# Patient Record
Sex: Male | Born: 1950
Health system: Southern US, Community
[De-identification: ages and names within clinical notes are randomized; demographics above are authoritative.]

## PROBLEM LIST (undated history)

## (undated) DIAGNOSIS — I10 Essential (primary) hypertension: Secondary | ICD-10-CM

## (undated) DIAGNOSIS — Z95820 Peripheral vascular angioplasty status with implants and grafts: Secondary | ICD-10-CM

## (undated) DIAGNOSIS — Z9109 Other allergy status, other than to drugs and biological substances: Secondary | ICD-10-CM

## (undated) DIAGNOSIS — T8859XA Other complications of anesthesia, initial encounter: Secondary | ICD-10-CM

## (undated) DIAGNOSIS — T4145XA Adverse effect of unspecified anesthetic, initial encounter: Secondary | ICD-10-CM

## (undated) DIAGNOSIS — Z87442 Personal history of urinary calculi: Secondary | ICD-10-CM

## (undated) DIAGNOSIS — J42 Unspecified chronic bronchitis: Secondary | ICD-10-CM

## (undated) DIAGNOSIS — Z8719 Personal history of other diseases of the digestive system: Secondary | ICD-10-CM

## (undated) DIAGNOSIS — I219 Acute myocardial infarction, unspecified: Secondary | ICD-10-CM

## (undated) DIAGNOSIS — K08109 Complete loss of teeth, unspecified cause, unspecified class: Secondary | ICD-10-CM

## (undated) DIAGNOSIS — I2510 Atherosclerotic heart disease of native coronary artery without angina pectoris: Secondary | ICD-10-CM

## (undated) DIAGNOSIS — Z972 Presence of dental prosthetic device (complete) (partial): Secondary | ICD-10-CM

## (undated) DIAGNOSIS — Z8739 Personal history of other diseases of the musculoskeletal system and connective tissue: Secondary | ICD-10-CM

## (undated) DIAGNOSIS — K4090 Unilateral inguinal hernia, without obstruction or gangrene, not specified as recurrent: Secondary | ICD-10-CM

## (undated) DIAGNOSIS — Z973 Presence of spectacles and contact lenses: Secondary | ICD-10-CM

## (undated) DIAGNOSIS — J189 Pneumonia, unspecified organism: Secondary | ICD-10-CM

## (undated) DIAGNOSIS — Z720 Tobacco use: Secondary | ICD-10-CM

## (undated) DIAGNOSIS — I214 Non-ST elevation (NSTEMI) myocardial infarction: Secondary | ICD-10-CM

## (undated) DIAGNOSIS — K219 Gastro-esophageal reflux disease without esophagitis: Secondary | ICD-10-CM

## (undated) HISTORY — PX: COLONOSCOPY W/ BIOPSIES AND POLYPECTOMY: SHX1376

## (undated) HISTORY — PX: MULTIPLE TOOTH EXTRACTIONS: SHX2053

## (undated) HISTORY — PX: APPENDECTOMY: SHX54

## (undated) HISTORY — PX: CHOLECYSTECTOMY: SHX55

---
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
## 1998-11-19 ENCOUNTER — Other Ambulatory Visit: Admission: RE | Admit: 1998-11-19 | Discharge: 1998-11-19 | Payer: Self-pay | Admitting: *Deleted

## 1999-02-04 ENCOUNTER — Encounter: Payer: Self-pay | Admitting: Neurosurgery

## 1999-02-04 ENCOUNTER — Ambulatory Visit (HOSPITAL_COMMUNITY): Admission: RE | Admit: 1999-02-04 | Discharge: 1999-02-04 | Payer: Self-pay | Admitting: Neurosurgery

## 1999-08-20 ENCOUNTER — Encounter: Payer: Self-pay | Admitting: Neurosurgery

## 1999-08-20 ENCOUNTER — Ambulatory Visit (HOSPITAL_COMMUNITY): Admission: RE | Admit: 1999-08-20 | Discharge: 1999-08-20 | Payer: Self-pay | Admitting: Neurosurgery

## 2000-02-08 ENCOUNTER — Encounter: Payer: Self-pay | Admitting: Neurosurgery

## 2000-02-08 ENCOUNTER — Ambulatory Visit (HOSPITAL_COMMUNITY): Admission: RE | Admit: 2000-02-08 | Discharge: 2000-02-08 | Payer: Self-pay | Admitting: Neurosurgery

## 2000-08-15 ENCOUNTER — Ambulatory Visit (HOSPITAL_COMMUNITY): Admission: RE | Admit: 2000-08-15 | Discharge: 2000-08-15 | Payer: Self-pay | Admitting: Neurosurgery

## 2000-08-15 ENCOUNTER — Encounter: Payer: Self-pay | Admitting: Neurosurgery

## 2001-11-12 ENCOUNTER — Emergency Department (HOSPITAL_COMMUNITY): Admission: EM | Admit: 2001-11-12 | Discharge: 2001-11-12 | Payer: Self-pay | Admitting: Emergency Medicine

## 2001-11-13 ENCOUNTER — Ambulatory Visit (HOSPITAL_COMMUNITY): Admission: RE | Admit: 2001-11-13 | Discharge: 2001-11-13 | Payer: Self-pay

## 2001-11-17 ENCOUNTER — Ambulatory Visit (HOSPITAL_COMMUNITY): Admission: RE | Admit: 2001-11-17 | Discharge: 2001-11-17 | Payer: Self-pay

## 2003-06-24 ENCOUNTER — Other Ambulatory Visit: Admission: RE | Admit: 2003-06-24 | Discharge: 2003-06-24 | Payer: Self-pay | Admitting: Otolaryngology

## 2003-11-18 ENCOUNTER — Ambulatory Visit (HOSPITAL_COMMUNITY): Admission: RE | Admit: 2003-11-18 | Discharge: 2003-11-18 | Payer: Self-pay | Admitting: Internal Medicine

## 2010-12-06 ENCOUNTER — Encounter: Payer: Self-pay | Admitting: Pulmonary Disease

## 2012-06-07 ENCOUNTER — Emergency Department (HOSPITAL_COMMUNITY)
Admission: EM | Admit: 2012-06-07 | Discharge: 2012-06-07 | Disposition: A | Discharge status: Home / Self Care | Payer: No Typology Code available for payment source | Attending: Emergency Medicine | Admitting: Emergency Medicine

## 2012-06-07 ENCOUNTER — Emergency Department (HOSPITAL_COMMUNITY): Payer: No Typology Code available for payment source

## 2012-06-07 ENCOUNTER — Encounter (HOSPITAL_COMMUNITY): Payer: Self-pay | Admitting: *Deleted

## 2012-06-07 DIAGNOSIS — N200 Calculus of kidney: Secondary | ICD-10-CM | POA: Insufficient documentation

## 2012-06-07 DIAGNOSIS — R1032 Left lower quadrant pain: Secondary | ICD-10-CM | POA: Insufficient documentation

## 2012-06-07 LAB — CBC WITH DIFFERENTIAL/PLATELET
Basophils Absolute: 0 10*3/uL (ref 0.0–0.1)
Basophils Relative: 0 % (ref 0–1)
Eosinophils Absolute: 0 10*3/uL (ref 0.0–0.7)
Eosinophils Relative: 0 % (ref 0–5)
HCT: 46.6 % (ref 39.0–52.0)
MCH: 31.5 pg (ref 26.0–34.0)
MCHC: 35.2 g/dL (ref 30.0–36.0)
Monocytes Absolute: 1 10*3/uL (ref 0.1–1.0)
Neutro Abs: 9.8 10*3/uL — ABNORMAL HIGH (ref 1.7–7.7)
RDW: 13.4 % (ref 11.5–15.5)

## 2012-06-07 LAB — COMPREHENSIVE METABOLIC PANEL
AST: 20 U/L (ref 0–37)
Albumin: 4 g/dL (ref 3.5–5.2)
Calcium: 9.6 mg/dL (ref 8.4–10.5)
Chloride: 104 mEq/L (ref 96–112)
Creatinine, Ser: 2.08 mg/dL — ABNORMAL HIGH (ref 0.50–1.35)
Total Protein: 8 g/dL (ref 6.0–8.3)

## 2012-06-07 MED ORDER — ONDANSETRON HCL 4 MG/2ML IJ SOLN
4.0000 mg | Freq: Once | INTRAMUSCULAR | Status: AC
  Administered 2012-06-07: 4 mg via INTRAVENOUS
  Filled 2012-06-07: qty 2

## 2012-06-07 MED ORDER — CIPROFLOXACIN HCL 500 MG PO TABS: 500.0000 mg | ORAL_TABLET | Freq: Two times a day (BID) | ORAL | Status: AC

## 2012-06-07 MED ORDER — KETOROLAC TROMETHAMINE 30 MG/ML IJ SOLN
30.0000 mg | Freq: Once | INTRAMUSCULAR | Status: AC
  Administered 2012-06-07: 30 mg via INTRAVENOUS
  Filled 2012-06-07: qty 1

## 2012-06-07 MED ORDER — PROMETHAZINE HCL 25 MG PO TABS: 25.0000 mg | ORAL_TABLET | Freq: Four times a day (QID) | ORAL | Status: DC | PRN

## 2012-06-07 MED ORDER — OXYCODONE-ACETAMINOPHEN 5-325 MG PO TABS: 1.0000 | ORAL_TABLET | Freq: Four times a day (QID) | ORAL | Status: AC | PRN

## 2012-06-07 MED ORDER — OXYCODONE-ACETAMINOPHEN 5-325 MG PO TABS: 1.0000 | ORAL_TABLET | Freq: Four times a day (QID) | ORAL | Status: DC | PRN

## 2012-06-07 MED ORDER — TAMSULOSIN HCL 0.4 MG PO CAPS: 0.4000 mg | ORAL_CAPSULE | Freq: Every day | ORAL | Status: DC

## 2012-06-07 MED ORDER — HYDROMORPHONE HCL PF 1 MG/ML IJ SOLN
1.0000 mg | Freq: Once | INTRAMUSCULAR | Status: AC
  Administered 2012-06-07: 1 mg via INTRAVENOUS
  Filled 2012-06-07: qty 1

## 2012-06-07 NOTE — ED Provider Notes (Addendum)
History  This chart was scribed for Benny Lennert, MD by Erskine Emery. This patient was seen in room APA02/APA02 and the patient's care was started at 22:13.   CSN: [id number]  Arrival date & time 06/07/12  2201   First MD Initiated Contact with Patient 06/07/12 2213      Chief Complaint  Patient presents with  . Abdominal Pain    (Consider location/radiation/quality/duration/timing/severity/associated sxs/prior Treatment) [name] is a 61 y.o. male who presents to the Emergency Department.  Patient is a 61 y.o. male presenting with abdominal pain. The history is provided by the patient. No language interpreter was used.  Abdominal Pain The primary symptoms of the illness include abdominal pain (LLQ), nausea and vomiting. The primary symptoms of the illness do not include fever, fatigue, diarrhea or dysuria. The current episode started yesterday. The onset of the illness was gradual. The problem has been gradually worsening.  Associated with: nothing. Symptoms associated with the illness do not include hematuria, frequency or back pain.  Pt reports he first noticed his left abdominal pain yesterday afternoon and the pain climaxed last night.  Pt reports a previous episode of similar symptoms only on the right for which he was diagnosed with gastritis. Pt has a h/o cholecystectomy and appendectomy.    History reviewed. No pertinent past medical history.  Past Surgical History  Procedure Date  . Cholecystectomy   . Appendectomy     History reviewed. No pertinent family history.  History  Substance Use Topics  . Smoking status: Current Everyday Smoker  . Smokeless tobacco: Not on file  . Alcohol Use: No      Review of Systems  Constitutional: Negative for fever and fatigue.  HENT: Negative for congestion, sinus pressure and ear discharge.   Eyes: Negative for discharge.  Respiratory: Negative for cough.   Cardiovascular: Negative for chest pain.  Gastrointestinal:  Positive for nausea, vomiting and abdominal pain (LLQ). Negative for diarrhea.  Genitourinary: Negative for dysuria, frequency and hematuria.  Musculoskeletal: Negative for back pain.  Skin: Negative for rash.  Neurological: Negative for seizures and headaches.  Hematological: Negative.   Psychiatric/Behavioral: Negative for hallucinations.    Allergies  Sulfa antibiotics  Home Medications  No current outpatient prescriptions on file.  Triage Vitals: BP 242/136  Pulse 87  Temp 99.5 F (37.5 C) (Oral)  Resp 20  Ht 5\' 7"  (1.702 m)  Wt 148 lb (67.132 kg)  BMI 23.18 kg/m2  SpO2 98%  Physical Exam  Constitutional: He is oriented to person, place, and time. He appears well-developed and well-nourished.  HENT:  Head: Normocephalic and atraumatic.  Eyes: Conjunctivae and EOM are normal. No scleral icterus.  Neck: Neck supple. No thyromegaly present.  Cardiovascular: Normal rate and regular rhythm.   No murmur heard. Pulmonary/Chest: Effort normal. No stridor.  Abdominal: He exhibits no distension. There is tenderness. There is no rebound.       Moderate tenderness in LLQ  Musculoskeletal: Normal range of motion. He exhibits no edema.  Neurological: He is oriented to person, place, and time. Coordination normal.  Skin: No rash noted. No erythema.  Psychiatric: He has a normal mood and affect. His behavior is normal.    ED Course  Procedures (including critical care time)  DIAGNOSTIC STUDIES: Oxygen Saturation is 98% on room air, normal by my interpretation.    COORDINATION OF CARE: 22:15--I evaluated the patient and we discussed a treatment plan including pain medication to which the pt agreed.  22:30--Medication orders: Hydromorphone (Dilaudid) injection 1 mg--once   Ondansetron (Zofran) injection 4 mg--once   Labs Reviewed - No data to display No results found.   No diagnosis found.   Date: 06/07/2012  Rate  70  Rhythm: normal sinus rhythm  QRS Axis: left   Intervals: normal  ST/T Wave abnormalities: nonspecific ST changes  Conduction Disutrbances:none  Narrative Interpretation:   Old EKG Reviewed: none available    MDM   The chart was scribed for me under my direct supervision.  I personally performed the history, physical, and medical decision making and all procedures in the evaluation of this patient.Benny Lennert, MD 06/07/12 2310  Benny Lennert, MD 06/07/12 [phone]

## 2012-06-07 NOTE — ED Notes (Signed)
Discussed pt's BP with EDP.  No orders received and pt okay to be discharged.

## 2012-06-07 NOTE — ED Notes (Addendum)
Lt abd pain since Tues, N/V,  No injury  Vomiting at triage

## 2012-06-08 ENCOUNTER — Emergency Department (HOSPITAL_COMMUNITY)
Admission: EM | Admit: 2012-06-08 | Discharge: 2012-06-08 | Disposition: A | Discharge status: Home / Self Care | Payer: Self-pay | Attending: Emergency Medicine | Admitting: Emergency Medicine

## 2012-06-08 ENCOUNTER — Encounter (HOSPITAL_COMMUNITY): Payer: Self-pay | Admitting: *Deleted

## 2012-06-08 DIAGNOSIS — N201 Calculus of ureter: Secondary | ICD-10-CM | POA: Insufficient documentation

## 2012-06-08 DIAGNOSIS — Z9089 Acquired absence of other organs: Secondary | ICD-10-CM | POA: Insufficient documentation

## 2012-06-08 DIAGNOSIS — F17200 Nicotine dependence, unspecified, uncomplicated: Secondary | ICD-10-CM | POA: Insufficient documentation

## 2012-06-08 MED ORDER — ONDANSETRON HCL 4 MG/2ML IJ SOLN
4.0000 mg | Freq: Once | INTRAMUSCULAR | Status: AC
  Administered 2012-06-08: 4 mg via INTRAMUSCULAR
  Filled 2012-06-08: qty 2

## 2012-06-08 MED ORDER — HYDROMORPHONE HCL PF 2 MG/ML IJ SOLN
1.0000 mg | Freq: Once | INTRAMUSCULAR | Status: AC
  Administered 2012-06-08: 1 mg via INTRAMUSCULAR
  Filled 2012-06-08 (×2): qty 1

## 2012-06-08 NOTE — ED Notes (Signed)
Left flank pain, seen yesterday for kidney stone pain, worse today, unable to get comfortable

## 2012-06-08 NOTE — ED Notes (Addendum)
Pt decided to stay and no longer leave AMA. Patient will be discharged normally

## 2012-06-08 NOTE — ED Notes (Signed)
Kidneystone pain.

## 2012-06-08 NOTE — ED Provider Notes (Signed)
History     CSN: [id number]  Arrival date & time 06/08/12  1904   First MD Initiated Contact with Patient 06/08/12 2007      Chief Complaint  Patient presents with  . Nephrolithiasis    (Consider location/radiation/quality/duration/timing/severity/associated sxs/prior treatment) HPI Patient reports she started having left-sided flank and abdominal pain 7 days ago. He was seen in the ED last night and diagnosed as having a ureteral stone. He reports when he takes a Percocet one tablet every is to 6 hours he only gets pain for least for about 2 hours. He relates he talked to somebody on the phone and they told him to come back stronger. He denies nausea, vomiting, or fever. He states his pain comes and goes and states it's starting to come back now.  PCP Dr. Juanetta Gosling  History reviewed. No pertinent past medical history.  Past Surgical History  Procedure Date  . Cholecystectomy   . Appendectomy     No family history on file.  History  Substance Use Topics  . Smoking status: Current Everyday Smoker  . Smokeless tobacco: Not on file  . Alcohol Use: No      Review of Systems  All other systems reviewed and are negative.    Allergies  Sulfa antibiotics  Home Medications   Current Outpatient Rx  Name Route Sig Dispense Refill  . OXYCODONE-ACETAMINOPHEN 5-325 MG PO TABS Oral Take 1 tablet by mouth every 6 (six) hours as needed for pain. 6 tablet 0  . BISACODYL 10 MG RE SUPP Rectal Place 10 mg rectally as needed.    Marland Kitchen CIPROFLOXACIN HCL 500 MG PO TABS Oral Take 1 tablet (500 mg total) by mouth every 12 (twelve) hours. 14 tablet 0  . PROMETHAZINE HCL 25 MG PO TABS Oral Take 1 tablet (25 mg total) by mouth every 6 (six) hours as needed for nausea. 15 tablet 0  . SIMETHICONE 125 MG PO CAPS Oral Take 1-2 capsules by mouth as needed.    Marland Kitchen TAMSULOSIN HCL 0.4 MG PO CAPS Oral Take 1 capsule (0.4 mg total) by mouth daily. 4 capsule 0    BP 226/120  Pulse 65  Temp 98.2 F (36.8  C) (Oral)  Resp 16  Ht 5\' 7"  (1.702 m)  Wt 150 lb (68.04 kg)  BMI 23.49 kg/m2  SpO2 99%  Vital signs normal except hypertension   Physical Exam  Nursing note and vitals reviewed. Constitutional: He appears well-developed and well-nourished. He is cooperative.  HENT:  Head: Normocephalic and atraumatic.  Eyes: Conjunctivae and EOM are normal. Pupils are equal, round, and reactive to light.  Neck: Normal range of motion and full passive range of motion without pain. Neck supple.  Cardiovascular: Normal rate, regular rhythm and normal heart sounds.   Pulmonary/Chest: Effort normal and breath sounds normal. Not tachypneic. No respiratory distress.  Neurological: He has normal strength and normal reflexes. No cranial nerve deficit.  Skin: Skin is warm, dry and intact.  Psychiatric: He has a normal mood and affect. His speech is normal and behavior is normal. Thought content normal.    ED Course  Procedures (including critical care time)   Medications  HYDROmorphone (DILAUDID) injection 1 mg (1 mg Intramuscular Given 06/08/12 2110)  ondansetron (ZOFRAN) injection 4 mg (4 mg Intramuscular Given 06/08/12 2110)    Discussed with patient he can take 2 pain tablets at a time. He states he got in injection last night and slept all night, it was repeated tonight.  Ct Abdomen Pelvis Wo Contrast  06/07/2012  *RADIOLOGY REPORT*  Clinical Data: Left lower quadrant abdominal pain.  CT ABDOMEN AND PELVIS WITHOUT CONTRAST  Technique:  Multidetector CT imaging of the abdomen and pelvis was performed following the standard protocol without intravenous contrast.  Comparison: None.  Findings: There is evidence of moderate left-sided hydronephrosis and hydroureter.  A calculus is present in the distal ureter nearly at the ureterovesicle junction and measuring approximately 4 mm in greatest dimensions.  Additional 7 mm calculus is present in the lower pole collecting system of the left kidney.  There is a  cyst in the medial left kidney measuring approximately 2.4 cm and demonstrating benign fluid density.  No right-sided renal calculi are identified.  The gallbladder has been removed.  Unenhanced appearance of other solid organs in the abdomen and the bowel are unremarkable.  The bladder is moderately distended.  No hernias are identified.  No incidental masses or enlarged lymph nodes.  No bony abnormalities.  IMPRESSION: Left-sided hydronephrosis secondary to a roughly 4 mm calculus in the distal ureter nearly at the ureterovesicle junction.  There is an additional 7 mm calculus in the lower pole collecting system of the left kidney.  Original Report Authenticated By: Reola Calkins, M.D.     1. Ureteral stone    Plan discharge  Devoria Albe, MD, FACEP    MDM          Ward Givens, MD 06/08/12 2127

## 2012-06-13 MED FILL — Oxycodone w/ Acetaminophen Tab 5-325 MG: ORAL | Qty: 6 | Status: AC

## 2012-08-15 DIAGNOSIS — I219 Acute myocardial infarction, unspecified: Secondary | ICD-10-CM

## 2012-08-15 HISTORY — DX: Acute myocardial infarction, unspecified: I21.9

## 2012-08-25 ENCOUNTER — Inpatient Hospital Stay (HOSPITAL_COMMUNITY): Payer: No Typology Code available for payment source

## 2012-08-25 ENCOUNTER — Ambulatory Visit (HOSPITAL_COMMUNITY): Admit: 2012-08-25 | Payer: Self-pay | Admitting: Cardiology

## 2012-08-25 ENCOUNTER — Encounter (HOSPITAL_COMMUNITY): Payer: Self-pay | Admitting: *Deleted

## 2012-08-25 ENCOUNTER — Inpatient Hospital Stay (HOSPITAL_COMMUNITY)
Admission: EM | Admit: 2012-08-25 | Discharge: 2012-08-28 | DRG: 247 | Disposition: A | Discharge status: Home / Self Care | Payer: No Typology Code available for payment source | Attending: Cardiology | Admitting: Cardiology

## 2012-08-25 ENCOUNTER — Encounter (HOSPITAL_COMMUNITY)
Admission: EM | Disposition: A | Discharge status: Home / Self Care | Payer: Self-pay | Source: Home / Self Care | Attending: Cardiology

## 2012-08-25 ENCOUNTER — Other Ambulatory Visit: Payer: Self-pay

## 2012-08-25 DIAGNOSIS — R079 Chest pain, unspecified: Secondary | ICD-10-CM

## 2012-08-25 DIAGNOSIS — I2510 Atherosclerotic heart disease of native coronary artery without angina pectoris: Secondary | ICD-10-CM

## 2012-08-25 DIAGNOSIS — Z881 Allergy status to other antibiotic agents status: Secondary | ICD-10-CM

## 2012-08-25 DIAGNOSIS — I2119 ST elevation (STEMI) myocardial infarction involving other coronary artery of inferior wall: Principal | ICD-10-CM | POA: Diagnosis present

## 2012-08-25 DIAGNOSIS — I4949 Other premature depolarization: Secondary | ICD-10-CM | POA: Diagnosis present

## 2012-08-25 DIAGNOSIS — Z8249 Family history of ischemic heart disease and other diseases of the circulatory system: Secondary | ICD-10-CM

## 2012-08-25 DIAGNOSIS — R259 Unspecified abnormal involuntary movements: Secondary | ICD-10-CM | POA: Diagnosis not present

## 2012-08-25 DIAGNOSIS — I4460 Unspecified fascicular block: Secondary | ICD-10-CM | POA: Diagnosis present

## 2012-08-25 DIAGNOSIS — G4730 Sleep apnea, unspecified: Secondary | ICD-10-CM | POA: Diagnosis present

## 2012-08-25 DIAGNOSIS — I119 Hypertensive heart disease without heart failure: Secondary | ICD-10-CM

## 2012-08-25 DIAGNOSIS — I498 Other specified cardiac arrhythmias: Secondary | ICD-10-CM | POA: Diagnosis present

## 2012-08-25 DIAGNOSIS — Z720 Tobacco use: Secondary | ICD-10-CM

## 2012-08-25 DIAGNOSIS — I10 Essential (primary) hypertension: Secondary | ICD-10-CM | POA: Diagnosis present

## 2012-08-25 DIAGNOSIS — F17200 Nicotine dependence, unspecified, uncomplicated: Secondary | ICD-10-CM | POA: Diagnosis present

## 2012-08-25 DIAGNOSIS — Z882 Allergy status to sulfonamides status: Secondary | ICD-10-CM

## 2012-08-25 HISTORY — DX: Atherosclerotic heart disease of native coronary artery without angina pectoris: I25.10

## 2012-08-25 HISTORY — DX: Tobacco use: Z72.0

## 2012-08-25 HISTORY — DX: Other allergy status, other than to drugs and biological substances: Z91.09

## 2012-08-25 HISTORY — DX: Essential (primary) hypertension: I10

## 2012-08-25 HISTORY — PX: LEFT HEART CATHETERIZATION WITH CORONARY ANGIOGRAM: SHX5451

## 2012-08-25 HISTORY — PX: PERCUTANEOUS CORONARY STENT INTERVENTION (PCI-S): SHX5485

## 2012-08-25 LAB — CBC WITH DIFFERENTIAL/PLATELET
Eosinophils Absolute: 0.3 10*3/uL (ref 0.0–0.7)
Eosinophils Relative: 3 % (ref 0–5)
HCT: 42.5 % (ref 39.0–52.0)
Hemoglobin: 14.7 g/dL (ref 13.0–17.0)
Lymphocytes Relative: 14 % (ref 12–46)
Lymphs Abs: 1.2 10*3/uL (ref 0.7–4.0)
MCH: 31.8 pg (ref 26.0–34.0)
MCV: 92 fL (ref 78.0–100.0)
Monocytes Relative: 7 % (ref 3–12)
RBC: 4.62 MIL/uL (ref 4.22–5.81)
WBC: 9.1 10*3/uL (ref 4.0–10.5)

## 2012-08-25 LAB — COMPREHENSIVE METABOLIC PANEL
Alkaline Phosphatase: 92 U/L (ref 39–117)
BUN: 17 mg/dL (ref 6–23)
CO2: 25 mEq/L (ref 19–32)
Calcium: 9.9 mg/dL (ref 8.4–10.5)
GFR calc Af Amer: 72 mL/min — ABNORMAL LOW (ref >90)
GFR calc non Af Amer: 62 mL/min — ABNORMAL LOW (ref >90)
Glucose, Bld: 101 mg/dL — ABNORMAL HIGH (ref 70–99)
Total Protein: 7.4 g/dL (ref 6.0–8.3)

## 2012-08-25 LAB — APTT: aPTT: 29 seconds (ref 24–37)

## 2012-08-25 SURGERY — LEFT HEART CATHETERIZATION WITH CORONARY ANGIOGRAM
Anesthesia: LOCAL

## 2012-08-25 MED ORDER — ACETAMINOPHEN 325 MG PO TABS: 650.0000 mg | ORAL_TABLET | ORAL | Status: DC | PRN

## 2012-08-25 MED ORDER — ASPIRIN 81 MG PO CHEW
CHEWABLE_TABLET | ORAL | Status: AC
  Filled 2012-08-25: qty 4

## 2012-08-25 MED ORDER — MORPHINE SULFATE 4 MG/ML IJ SOLN
4.0000 mg | Freq: Once | INTRAMUSCULAR | Status: AC
  Administered 2012-08-25: 4 mg via INTRAVENOUS
  Filled 2012-08-25: qty 1

## 2012-08-25 MED ORDER — ASPIRIN EC 81 MG PO TBEC
81.0000 mg | DELAYED_RELEASE_TABLET | Freq: Every day | ORAL | Status: DC
  Administered 2012-08-26 – 2012-08-28 (×3): 81 mg via ORAL
  Filled 2012-08-25 (×3): qty 1

## 2012-08-25 MED ORDER — NITROGLYCERIN 0.2 MG/ML ON CALL CATH LAB
INTRAVENOUS | Status: AC
  Administered 2012-08-25: 19:00:00
  Filled 2012-08-25: qty 1

## 2012-08-25 MED ORDER — FENTANYL CITRATE 0.05 MG/ML IJ SOLN
INTRAMUSCULAR | Status: AC
  Filled 2012-08-25: qty 2

## 2012-08-25 MED ORDER — PRASUGREL HCL 10 MG PO TABS
10.0000 mg | ORAL_TABLET | Freq: Every day | ORAL | Status: DC
  Administered 2012-08-26 – 2012-08-28 (×3): 10 mg via ORAL
  Filled 2012-08-25 (×3): qty 1

## 2012-08-25 MED ORDER — ONDANSETRON HCL 4 MG/2ML IJ SOLN
INTRAMUSCULAR | Status: AC
  Administered 2012-08-25: 4 mg
  Filled 2012-08-25: qty 2

## 2012-08-25 MED ORDER — HYDRALAZINE HCL 20 MG/ML IJ SOLN
INTRAMUSCULAR | Status: AC
  Administered 2012-08-25: 10 mg
  Filled 2012-08-25: qty 1

## 2012-08-25 MED ORDER — SODIUM CHLORIDE 0.9 % IV SOLN
INTRAVENOUS | Status: DC
  Administered 2012-08-25: 20 mL/h via INTRAVENOUS

## 2012-08-25 MED ORDER — ONDANSETRON HCL 4 MG/2ML IJ SOLN
4.0000 mg | Freq: Four times a day (QID) | INTRAMUSCULAR | Status: DC | PRN
  Administered 2012-08-25: 4 mg via INTRAVENOUS
  Filled 2012-08-25: qty 2

## 2012-08-25 MED ORDER — SODIUM CHLORIDE 0.9 % IV SOLN: INTRAVENOUS | Status: AC

## 2012-08-25 MED ORDER — SODIUM CHLORIDE 0.9 % IV SOLN
0.2500 mg/kg/h | INTRAVENOUS | Status: AC
  Filled 2012-08-25: qty 250

## 2012-08-25 MED ORDER — MIDAZOLAM HCL 2 MG/2ML IJ SOLN
INTRAMUSCULAR | Status: AC
  Filled 2012-08-25: qty 2

## 2012-08-25 MED ORDER — ATORVASTATIN CALCIUM 80 MG PO TABS
80.0000 mg | ORAL_TABLET | Freq: Every day | ORAL | Status: DC
  Administered 2012-08-27: 80 mg via ORAL
  Filled 2012-08-25 (×3): qty 1

## 2012-08-25 MED ORDER — NITROGLYCERIN 0.4 MG SL SUBL: 0.4000 mg | SUBLINGUAL_TABLET | SUBLINGUAL | Status: DC | PRN

## 2012-08-25 MED ORDER — NITROGLYCERIN IN D5W 200-5 MCG/ML-% IV SOLN
5.0000 ug/min | Freq: Once | INTRAVENOUS | Status: AC
  Administered 2012-08-25: 5 ug/min via INTRAVENOUS
  Filled 2012-08-25: qty 250

## 2012-08-25 MED ORDER — HEPARIN (PORCINE) IN NACL 2-0.9 UNIT/ML-% IJ SOLN
INTRAMUSCULAR | Status: AC
  Filled 2012-08-25: qty 1000

## 2012-08-25 MED ORDER — TAMSULOSIN HCL 0.4 MG PO CAPS
0.4000 mg | ORAL_CAPSULE | Freq: Every day | ORAL | Status: DC
  Administered 2012-08-26 – 2012-08-27 (×2): 0.4 mg via ORAL
  Filled 2012-08-25 (×4): qty 1

## 2012-08-25 MED ORDER — NITROGLYCERIN 0.4 MG SL SUBL
SUBLINGUAL_TABLET | SUBLINGUAL | Status: AC
  Filled 2012-08-25: qty 25

## 2012-08-25 MED ORDER — BIVALIRUDIN 250 MG IV SOLR
INTRAVENOUS | Status: AC
  Filled 2012-08-25: qty 250

## 2012-08-25 MED ORDER — ZOLPIDEM TARTRATE 5 MG PO TABS: 5.0000 mg | ORAL_TABLET | Freq: Every evening | ORAL | Status: DC | PRN

## 2012-08-25 MED ORDER — PRASUGREL HCL 10 MG PO TABS
ORAL_TABLET | ORAL | Status: AC
  Filled 2012-08-25: qty 6

## 2012-08-25 MED ORDER — METOPROLOL TARTRATE 12.5 MG HALF TABLET
12.5000 mg | ORAL_TABLET | Freq: Two times a day (BID) | ORAL | Status: DC
  Administered 2012-08-25 – 2012-08-28 (×6): 12.5 mg via ORAL
  Filled 2012-08-25 (×7): qty 1

## 2012-08-25 MED ORDER — ONDANSETRON HCL 4 MG/5ML PO SOLN
4.0000 mg | Freq: Once | ORAL | Status: AC
  Filled 2012-08-25: qty 1

## 2012-08-25 MED ORDER — ATROPINE SULFATE 1 MG/ML IJ SOLN
INTRAMUSCULAR | Status: AC
  Filled 2012-08-25: qty 1

## 2012-08-25 MED ORDER — LIDOCAINE HCL (PF) 1 % IJ SOLN
INTRAMUSCULAR | Status: AC
  Filled 2012-08-25: qty 30

## 2012-08-25 MED ORDER — HEPARIN SODIUM (PORCINE) 5000 UNIT/ML IJ SOLN
4000.0000 [IU] | INTRAMUSCULAR | Status: AC
  Administered 2012-08-25: 4000 [IU] via INTRAVENOUS
  Filled 2012-08-25 (×2): qty 1

## 2012-08-25 MED ORDER — ONDANSETRON HCL 4 MG/2ML IJ SOLN
INTRAMUSCULAR | Status: AC
  Filled 2012-08-25: qty 2

## 2012-08-25 NOTE — ED Notes (Signed)
Pt presents via EMS secondary to a near syncope episode, pt states does not remember sitting on the floor. Pt states was light-headed, diaphoretic and felt like he was gonna faint. Pt c/o right sided ache in chest. Denies n/v and SOB. PVC's noted on EKG, otherwise normal sinus brady. Pt remains hypertensive at this time. Denies hypertension diagnosis. Pt is alert, oriented x 4 with warm, dry and pink skin.

## 2012-08-25 NOTE — ED Provider Notes (Signed)
2:53 PM  Date: 08/25/2012  Rate: 57  Rhythm: sinus bradycardia, sinus arrhythmia and premature ventricular contractions (PVC)  QRS Axis: left  Intervals: normal QRS:  Poor R wave progression in precordial leads suggests old anterior myocardial infarction.  Left ventricular hypertrophy.  ST/T Wave abnormalities: normal  Conduction Disutrbances:left anterior fascicular block  Narrative Interpretation: Abnormal EKG  Old EKG Reviewed: changes noted--Has developed PVC's since tracing of7/24/2013.    Carleene Cooper III, MD 08/25/12 [phone]

## 2012-08-25 NOTE — H&P (Signed)
HPI:  This gentleman has noticed chest pain off and on for the past two weeks.  Today he thought it was indigestion and went to the store where he had syncope.  He was picked up by EMS and taken to [facility].  His initial ECG showed LVH with repolarization change and PVCs.  He was started on a NTG drip and heparin, and brought in transport to [facility].  Here his ECG shows minor inferior ST but inverted T waves.  He has persistent 1-2/10 chest pain that has not abated with NTG and heparin.  We are titrating his NTG presently, and he remains persistently hypertensive.  We have recommended urgent cardiac catheterization.    Current Facility-Administered Medications  Medication Dose Route Frequency Provider Last Rate Last Dose  . 0.9 %  sodium chloride infusion   Intravenous Continuous Ward Givens, MD 20 mL/hr at 08/25/12 1552 20 mL/hr at 08/25/12 1552  . aspirin 81 MG chewable tablet           . heparin injection 4,000 Units  4,000 Units Intravenous STAT Ward Givens, MD   4,000 Units at 08/25/12 1554  . morphine 4 MG/ML injection 4 mg  4 mg Intravenous Once Ward Givens, MD   4 mg at 08/25/12 1549  . nitroGLYCERIN (NITROSTAT) 0.4 MG SL tablet           . nitroGLYCERIN 0.2 mg/mL in dextrose 5 % infusion  5-200 mcg/min Intravenous Once Ward Givens, MD 1.5 mL/hr at 08/25/12 0500 5 mcg/min at 08/25/12 0500  . ondansetron (ZOFRAN) 4 MG/2ML injection        4 mg at 08/25/12 1550  . ondansetron (ZOFRAN) 4 MG/5ML solution 4 mg  4 mg Oral Once Ward Givens, MD        Allergies  Allergen Reactions  . Sulfa Antibiotics Hives    History reviewed. No pertinent past medical history.  He has had some intermitted hypertension in the past, and never treated.  He sees Dr. Juanetta Gosling.    Past Surgical History  Procedure Date  . Cholecystectomy   . Appendectomy     No family history on file.  History   Social History  . Marital Status: Married    Spouse Name: N/A    Number of Children: 4 children two boys  and girls  . Years of Education: N/A   Occupational History  . Not on file.   Social History Main Topics  . Smoking status: Current Every Day Smoker -- 0.5 packs/day  . Smokeless tobacco: Not on file  . Alcohol Use: No  . Drug Use: No  . Sexually Active:    Other Topics Concern  . Not on file   Social History Narrative  . No narrative on file    ROS: Please see the HPI.  All other systems reviewed and negative.  PHYSICAL EXAM:  BP 172/109  Pulse 42  Temp 98.6 F (37 C) (Oral)  Resp 18  Ht 5\' 8"  (1.727 m)  Wt 150 lb (68.04 kg)  BMI 22.81 kg/m2  SpO2 99%  General: Thin gentleman in no distress, in no acute distress. Head:  Normocephalic and atraumatic. Neck: no JVD Lungs: Clear to auscultation and percussion. Heart: Normal S1 and S2.  No murmur, rubs or gallops.  Abdomen:  Normal bowel sounds; soft; non tender; no organomegaly Pulses: Pulses normal in all 4 extremities. Extremities: No clubbing or cyanosis. No edema. Neurologic: Alert and oriented x 3.  EKG:  NSR.  1mm inferior ST elevation now, new from prior tracing.  ? Reperfusion.  Remainder consistent with LVH.  ASSESSMENT AND PLAN:  1.  Ongoing chest pain   -  Borderline STE now, new from prior tracing-?reperfused infarct--Ts inverted inferioly..   2.  HTN 3.  Prior appendectomy 4.  Prior cholycystectomy   Plan 1.  With new changes will proceed with urgent cath.  Dr. Clifton Raquan to perform.  Cath lab team called.   Findings explained to patient, and risks and benefits of cardiac cath discussed in detail with patient.

## 2012-08-25 NOTE — ED Notes (Signed)
Received pt via EMS for near syncope episode at local business and hypertension. Rt sided chest ache at times per pt

## 2012-08-25 NOTE — Interval H&P Note (Signed)
History and Physical Interval Note:  08/25/2012 6:16 PM  [name]  has presented today for cardiac cath  with the diagnosis of stemi.  The various methods of treatment have been discussed with the patient and family. After consideration of risks, benefits and other options for treatment, the patient has consented to  Procedure(s) (LRB) with comments: LEFT HEART CATHETERIZATION WITH CORONARY ANGIOGRAM (N/A) as a surgical intervention .  The patient's history has been reviewed, patient examined, no change in status, stable for surgery.  I have reviewed the patient's chart and labs.  Questions were answered to the patient's satisfaction.     Mindy Gali

## 2012-08-25 NOTE — ED Provider Notes (Signed)
History  This chart was scribed for Ward Givens, MD by Erskine Emery. This patient was seen in room APA05/APA05 and the patient's care was started at 15:07.   CSN: [id number]  Arrival date & time 08/25/12  1440   First MD Initiated Contact with Patient 08/25/12 1507      Chief Complaint  Patient presents with  . Hypertension  . Near Syncope    (Consider location/radiation/quality/duration/timing/severity/associated sxs/prior treatment) The history is provided by the patient. No language interpreter was used.  [name] is a 61 y.o. male brought in by ambulance, who presents to the Emergency Department complaining of LOC, diaphoresis, and an intermittent, waxing and waning, aching "indigestion" pain in the mid chest with no radiation since 1:30pm this afternoon. Pt reports he doesn't remember falling but remembers the ambulance coming. Pt reports 2 previous episodes of similar symptoms without syncope of about 10-15 minutes in duration last week week with an onset just after eating. Pt had not eaten prior to this episode. He reports he started having the pain the just before he passed out he got hot, diaphoretic and nauseated. Pt denies any associated emesis, nausea, SOB, or aggravating or relieving factors. Pt denies eating anything different lately. Pt denies taking anything for the pain but reports he was given aspirin via EMS en route. Pt had a stress test about a year ago for a check up with all negative findings.  Dr. Juanetta Gosling is the pt's PCP.  History reviewed. No pertinent past medical history.  Past Surgical History  Procedure Date  . Cholecystectomy   . Appendectomy     No family history on file.   Pt reports a h/o heart problems in the family. His brother who is in his 40s had a massive heart attack and subsequent bypass surgery. Pt also reports his uncle on his maternal side had a heart attack later in life, had a pacemaker placement, and is now deceased.     History   Substance Use Topics  . Smoking status: Current Every Day Smoker -- 0.5 packs/day  . Smokeless tobacco: Not on file  . Alcohol Use: No   Pt reports he smokes a pack of cigarettes about every 1.5 days. Pt denies drinking. Self-employed  Review of Systems  Constitutional: Positive for diaphoresis. Negative for activity change and appetite change.  HENT: Negative for tinnitus.   Respiratory: Negative for shortness of breath.   Cardiovascular: Positive for chest pain.  Gastrointestinal: Negative for nausea and vomiting.  Genitourinary: Negative for dysuria.  Musculoskeletal: Negative for joint swelling.  Skin: Negative for rash.  Neurological: Positive for syncope.  Psychiatric/Behavioral: Negative for confusion.  All other systems reviewed and are negative.    Allergies  Sulfa antibiotics  Home Medications   Current Outpatient Rx  Name Route Sig Dispense Refill  . BISACODYL 10 MG RE SUPP Rectal Place 10 mg rectally as needed.    Marland Kitchen PROMETHAZINE HCL 25 MG PO TABS Oral Take 1 tablet (25 mg total) by mouth every 6 (six) hours as needed for nausea. 15 tablet 0  . SIMETHICONE 125 MG PO CAPS Oral Take 1-2 capsules by mouth as needed.    Marland Kitchen TAMSULOSIN HCL 0.4 MG PO CAPS Oral Take 1 capsule (0.4 mg total) by mouth daily. 4 capsule 0    Triage Vitals: BP 207/122  Pulse 64  Temp 97.8 F (36.6 C) (Oral)  Resp 18  Ht 5\' 8"  (1.727 m)  Wt 150 lb (68.04 kg)  BMI  22.81 kg/m2  SpO2 98%  Vital signs normal except hypertension, bradycardia  Physical Exam  Nursing note and vitals reviewed. Constitutional: He is oriented to person, place, and time. He appears well-developed and well-nourished.  Non-toxic appearance. He does not appear ill. No distress.  HENT:  Head: Normocephalic and atraumatic.  Right Ear: External ear normal.  Left Ear: External ear normal.  Nose: Nose normal. No mucosal edema or rhinorrhea.  Mouth/Throat: Oropharynx is clear and moist and mucous membranes are  normal. No dental abscesses or uvula swelling.  Eyes: Conjunctivae normal and EOM are normal. Pupils are equal, round, and reactive to light.  Neck: Normal range of motion and full passive range of motion without pain. Neck supple.  Cardiovascular: Regular rhythm and normal heart sounds.  Bradycardia present.  Exam reveals no gallop and no friction rub.   No murmur heard. Pulmonary/Chest: Effort normal and breath sounds normal. No respiratory distress. He has no wheezes. He has no rhonchi. He has no rales. He exhibits no tenderness and no crepitus.  Abdominal: Soft. Normal appearance and bowel sounds are normal. He exhibits no distension. There is no tenderness. There is no rebound and no guarding.  Musculoskeletal: Normal range of motion. He exhibits no edema and no tenderness.       Moves all extremities well.   Neurological: He is alert and oriented to person, place, and time. He has normal strength. No cranial nerve deficit.  Skin: Skin is warm, dry and intact. No rash noted. No erythema. No pallor.  Psychiatric: He has a normal mood and affect. His speech is normal and behavior is normal. His mood appears not anxious.    ED Course  Procedures (including critical care time)   Medications  0.9 %  sodium chloride infusion (20 mL/hr Intravenous New Bag/Given 08/25/12 1552)  aspirin 81 MG chewable tablet (324 mg  Not Given 08/25/12 1554)  nitroGLYCERIN (NITROSTAT) 0.4 MG SL tablet (   Not Given 08/25/12 1604)  heparin injection 4,000 Units (4000 Units Intravenous Given 08/25/12 1554)  morphine 4 MG/ML injection 4 mg (4 mg Intravenous Given 08/25/12 1549)  ondansetron (ZOFRAN) 4 MG/5ML solution 4 mg (0 mg Oral Duplicate 08/25/12 1603)  ondansetron (ZOFRAN) 4 MG/2ML injection (4 mg  Given 08/25/12 1550)  nitroGLYCERIN 0.2 mg/mL in dextrose 5 % infusion (5 mcg/min Intravenous New Bag/Given 08/25/12 0500)    DIAGNOSTIC STUDIES: Oxygen Saturation is 98% on room air, normal by my  interpretation.    COORDINATION OF CARE: 15:45--I evaluated the patient and we discussed a treatment plan including blood thinners and transfer to [facility] for cardiology to which the pt agreed.   15:43 Code Stemi called after reviewing his EKG  15:55--Consulted with Dr. Riley Kill. Pt is going to CCU. He is cancelling the Code Stemi  Results for orders placed during the hospital encounter of 08/25/12  CBC WITH DIFFERENTIAL      Component Value Range   WBC 9.1  4.0 - 10.5 K/uL   RBC 4.62  4.22 - 5.81 MIL/uL   Hemoglobin 14.7  13.0 - 17.0 g/dL   HCT 29.5  62.1 - 30.8 %   MCV 92.0  78.0 - 100.0 fL   MCH 31.8  26.0 - 34.0 pg   MCHC 34.6  30.0 - 36.0 g/dL   RDW 65.7  84.6 - 96.2 %   Platelets 164  150 - 400 K/uL   Neutrophils Relative 76  43 - 77 %   Neutro Abs 6.9  1.7 -  7.7 K/uL   Lymphocytes Relative 14  12 - 46 %   Lymphs Abs 1.2  0.7 - 4.0 K/uL   Monocytes Relative 7  3 - 12 %   Monocytes Absolute 0.7  0.1 - 1.0 K/uL   Eosinophils Relative 3  0 - 5 %   Eosinophils Absolute 0.3  0.0 - 0.7 K/uL   Basophils Relative 0  0 - 1 %   Basophils Absolute 0.0  0.0 - 0.1 K/uL  COMPREHENSIVE METABOLIC PANEL      Component Value Range   Sodium 140  135 - 145 mEq/L   Potassium 4.2  3.5 - 5.1 mEq/L   Chloride 105  96 - 112 mEq/L   CO2 25  19 - 32 mEq/L   Glucose, Bld 101 (*) 70 - 99 mg/dL   BUN 17  6 - 23 mg/dL   Creatinine, Ser 9.60  0.50 - 1.35 mg/dL   Calcium 9.9  8.4 - 45.4 mg/dL   Total Protein 7.4  6.0 - 8.3 g/dL   Albumin 3.9  3.5 - 5.2 g/dL   AST 19  0 - 37 U/L   ALT 17  0 - 53 U/L   Alkaline Phosphatase 92  39 - 117 U/L   Total Bilirubin 0.3  0.3 - 1.2 mg/dL   GFR calc non Af Amer 62 (*) >90 mL/min   GFR calc Af Amer 72 (*) >90 mL/min  APTT      Component Value Range   aPTT 29  24 - 37 seconds  TROPONIN I      Component Value Range   Troponin I 8.80 (*) <0.30 ng/mL   Laboratory interpretation all normal except + troponin  No results found.  EKG documented by Dr  Ignacia Palma   Date: 08/26/2012  Rate: 57  Rhythm: sinus bradycardia and premature ventricular contractions (PVC)  QRS Axis: left  Intervals: normal  ST/T Wave abnormalities: biphasic T waves inferiorly, inverted TW laterally  Conduction Disutrbances:LVH with widened QRS  Narrative Interpretation: Q waves anteriorly  Old EKG Reviewed: none available    1. Chest pain   NSTEMI    Plan transfer to [facility]  Devoria Albe, MD, FACEP   CRITICAL CARE Performed by: Devoria Albe L   Total critical care time: 50   Min   Critical care time was exclusive of separately billable procedures and treating other patients.  Critical care was necessary to treat or prevent imminent or life-threatening deterioration.  Critical care was time spent personally by me on the following activities: development of treatment plan with patient and/or surrogate as well as nursing, discussions with consultants, evaluation of patient's response to treatment, examination of patient, obtaining history from patient or surrogate, ordering and performing treatments and interventions, ordering and review of laboratory studies, ordering and review of radiographic studies, pulse oximetry and re-evaluation of patient's condition.    MDM   I personally performed the services described in this documentation, which was scribed in my presence. The recorded information has been reviewed and considered.  Devoria Albe, MD, Armando Gang    Ward Givens, MD 08/26/12 [phone]

## 2012-08-25 NOTE — Progress Notes (Signed)
Chaplain Note:  Chaplain visited with pt and family prior to pt being taken to cath lab.  During the procedure, chaplain provided spiritual comfort and support for cath lab staff, pt, and family. All expressed appreciation for chaplain support. Chaplain will follow up as needed.  08/25/12 1722  Clinical Encounter Type  Visited With Patient and family together  Visit Type Spiritual support  Referral From Other (Comment) (Code STEMI page)  Spiritual Encounters  Spiritual Needs Emotional  Stress Factors  Patient Stress Factors Major life changes;Health changes  Family Stress Factors Major life changes   Verdie Shire, [place] [phone]

## 2012-08-25 NOTE — CV Procedure (Signed)
Cardiac Catheterization Operative Report  DARVIS CROFT [id number] 10/11/20137:19 PM Fredirick Maudlin, MD  Procedure Performed:  1. Left Heart Catheterization 2. Selective Coronary Angiography 3. Left ventricular angiogram 4. PTCA/DES x 1 distal OM1 5. Angioseal RFA  Operator: Verne Carrow, MD  Indication: 61 yo male who began to have chest pain this afternoon. He thought it was indigestion and went to the store where he had syncope. He was picked up by EMS and taken to [facility] ED. His initial ECG showed LVH with repolarization change and PVCs. He was started on a NTG drip and heparin, and brought in transport to [facility]. Here his ECG shows minor inferior ST but inverted T waves. He has persistent 1-2/10 chest pain that has not abated with NTG and heparin. He was seen upon arrival by Dr. Riley Kill and plans were made for urgent cardiac cath.                                  Procedure Details: The risks, benefits, complications, treatment options, and expected outcomes were discussed with the patient. The patient and/or family concurred with the proposed plan, giving informed consent. The patient was brought to the cath lab after emergently.  The patient was further sedated with Versed and Fentanyl. The right groin was prepped and draped in the usual manner. Using the modified Seldinger access technique, a 6 French sheath was placed in the right femoral artery. Standard diagnostic catheters were used to perform selective coronary angiography. He was found to have a total occlusion of the distal first obtuse marginal branch of the Circumflex artery. He was given a bolus of Angiomax and a drip was started. He was given 60 mg of Effient po x 1. I then engaged the left main with a XB LAD 3.5 guiding catheter. I then passed a Whisper wire down the first obtuse marginal branch beyond the area of complete occlusion. Flow was reestablished down the vessel with the wire. I then used a 2.5 x 12 mm  balloon x 2 to open the occlusion. At this point, the patient had asystole for 10 seconds. He was given IV atropine 1mg  x 1. His HR responded well, but the guide dislodged during the episode and wire position was lost. I then engaged the left main with a JL-4 guide and passed the Whisper wire back down the marginal branch beyond the stenosis. I then deployed a 2.75 x 15 mm Promus DES in the distal OM branch. This was post-dilated with a 2.75 x 12 mm Walnut balloon x 1. The stenosis was taken from 100% down to 0%. The vessel became very small in caliber and bifurcated beyond the stented segment. Final angiography demonstrated flow into the distal vessel, however, this is a small caliber distal system with diffuse disease.  A pigtail catheter was used to perform a left ventricular angiogram. An Angioseal femoral artery closure device was placed in the right femoral artery.  There were no immediate complications. The patient was taken to the recovery area in stable condition.   Hemodynamic Findings: Central aortic pressure: 103/76 Left ventricular pressure: 114/4/11  Angiographic Findings:  Left main:  No obstructive disease.   Left Anterior Descending Artery: Large caliber vessel that courses to the apex. There are mild luminal irregularities in the mid vessel. The diagonal branch is moderate sized and has no obstructive disease.   Ramus Intermediate: Large caliber vessel with serial 20%  stenoses proximal and mid vessel.   Circumflex Artery:  Large caliber vessel that terminates into a moderate sized obtuse marginal branch. The distal portion of the marginal branch is completely occluded.   Right Coronary Artery: Small, non-dominant vessel with mild plaque.   Left Ventricular Angiogram: LVEF=60-65%.  Impression: 1. Acute inferolateral MI secondary to occluded first OM branch distally 2. Successful PTCA/DES x 1 OM1 3. Mild non-obstructive disease LAD.  4. Preserved LV systolic  function  Recommendations: Will continue ASA/Effient. Will start beta blocker as BP tolerates. Will start statin. CCU. Echo before discharge.        Complications:  None. The patient tolerated the procedure well.

## 2012-08-26 DIAGNOSIS — I2119 ST elevation (STEMI) myocardial infarction involving other coronary artery of inferior wall: Secondary | ICD-10-CM | POA: Diagnosis present

## 2012-08-26 DIAGNOSIS — R079 Chest pain, unspecified: Secondary | ICD-10-CM

## 2012-08-26 LAB — BASIC METABOLIC PANEL
BUN: 15 mg/dL (ref 6–23)
CO2: 20 mEq/L (ref 19–32)
Calcium: 8.9 mg/dL (ref 8.4–10.5)
GFR calc non Af Amer: 62 mL/min — ABNORMAL LOW (ref >90)
Glucose, Bld: 119 mg/dL — ABNORMAL HIGH (ref 70–99)
Sodium: 142 mEq/L (ref 135–145)

## 2012-08-26 LAB — LIPID PANEL
Cholesterol: 145 mg/dL (ref 0–200)
HDL: 38 mg/dL — ABNORMAL LOW (ref >39)
LDL Cholesterol: 90 mg/dL (ref 0–99)
Triglycerides: 84 mg/dL (ref <150)

## 2012-08-26 LAB — CBC
HCT: 37.3 % — ABNORMAL LOW (ref 39.0–52.0)
Hemoglobin: 12.8 g/dL — ABNORMAL LOW (ref 13.0–17.0)
MCH: 30.9 pg (ref 26.0–34.0)
MCHC: 34.3 g/dL (ref 30.0–36.0)
MCV: 90.1 fL (ref 78.0–100.0)
RBC: 4.14 MIL/uL — ABNORMAL LOW (ref 4.22–5.81)

## 2012-08-26 MED ORDER — ZOLPIDEM TARTRATE 5 MG PO TABS
10.0000 mg | ORAL_TABLET | Freq: Once | ORAL | Status: DC
  Filled 2012-08-26: qty 2

## 2012-08-26 NOTE — Progress Notes (Signed)
08/26/12 0100  Vitals  Pulse Rate ! 59   ECG Heart Rate 60   BP 117/78 mmHg  MAP (mmHg) 88   Resp 12   Oxygen Therapy  SpO2 98 %  O2 Device None (Room air)   Results for DOYNE, MICKE (MRN [id number]) as of 08/26/2012 07:18  Ref. Range 08/26/2012 01:16  Troponin I Latest Range: <0.30 ng/mL >20.00 (HH)  Dr Frederico Hamman called about the troponin result. Informed MD that pt had PCI tonight. Pt stable. No complaints of chest pain.Katria Botts [place]

## 2012-08-26 NOTE — Progress Notes (Signed)
CRITICAL VALUE ALERT  Critical value received: troponin Date of notification:10/11/13Time of notification:   Critical value read back:yes  Nurse who received alert:  Bolivar Haw  MD notified (1st page): pt just just had PCI. Time of first page:  MD notified (2nd page):  Time of second page:  Responding MD: Time MD responded:

## 2012-08-26 NOTE — Progress Notes (Addendum)
CARDIAC REHAB PHASE I   PRE:  Rate/Rhythm: 54 SB  BP:  Supine: 124/77 Sitting:   Standing:    SaO2: 99% 2.5L O2  MODE:  Ambulation: 700 ft   POST:  Rate/Rhythm: 55 SB  BP:  Supine:   Sitting: 135/94  Standing:    SaO2: 99% RA  0800-0840  Pt ambulated 700 ft with assist x1. Tolerated well, gait steady, no c/o 2 standing rest breaks taken. To chair after ambulation. MI book given. MI/PCI education completed including risk factor modification, smoking cessation, heart healthy eating, activity progression, CP, NTG use and calling 911. Discussed Phase II Cardiac Rehab and pt is interested, permission given to send contact info to CR at [facility]. Pt voices understanding of instructions given.  Annetta Maw

## 2012-08-26 NOTE — Progress Notes (Signed)
Patient ID: LEXTON HIDALGO, male   DOB: 07/18/1951, 61 y.o.   MRN: [id number]   Patient Name: [name] Date of Encounter: 08/26/2012    SUBJECTIVE  Had trouble sleeping. No chest pain or shortness of breath. Has been out of bed in chair eating breakfast.  CURRENT MEDS    . aspirin      . aspirin EC  81 mg Oral Daily  . atorvastatin  80 mg Oral q1800  . atropine      . bivalirudin      . fentaNYL      . heparin      . heparin  4,000 Units Intravenous STAT  . hydrALAZINE      . lidocaine      . metoprolol tartrate  12.5 mg Oral BID  . midazolam      . morphine  4 mg Intravenous Once  . nitroGLYCERIN      . nitroGLYCERIN      . ondansetron      . ondansetron      . ondansetron  4 mg Oral Once  . prasugrel      . prasugrel  10 mg Oral Daily  . Tamsulosin HCl  0.4 mg Oral Daily    OBJECTIVE  Filed Vitals:   08/26/12 0100 08/26/12 0200 08/26/12 0353 08/26/12 0749  BP: 117/78 111/78 119/74   Pulse: 59     Temp:   98.3 F (36.8 C) 98.1 F (36.7 C)  TempSrc:   Oral Oral  Resp: 12 17 16    Height:      Weight:      SpO2: 98%  100%     Intake/Output Summary (Last 24 hours) at 08/26/12 0918 Last data filed at 08/26/12 0700  Gross per 24 hour  Intake  469.9 ml  Output    850 ml  Net -380.1 ml   Filed Weights   08/25/12 1448 08/25/12 1601 08/25/12 1751  Weight: 150 lb (68.04 kg) 150 lb (68.04 kg) 144 lb 2.9 oz (65.4 kg)    PHYSICAL EXAM  General: Pleasant, NAD. Neuro: Alert and oriented X 3. Moves all extremities spontaneously. Psych: Normal affect. HEENT:  Normal  Neck: Supple without bruits or JVD. Lungs:  Resp regular and unlabored, CTA. Heart: RRR no s3, s4, or murmurs. Abdomen: Soft, non-tender, non-distended, BS + x 4.  Extremities: No clubbing, cyanosis or edema. DP/PT/Radials 2+ and equal bilaterally. Right groin with Angio-Seal and stable. Good distal pulse. Accessory Clinical Findings  CBC  Basename 08/26/12 0116 08/25/12 1542  WBC 9.2 9.1    NEUTROABS -- 6.9  HGB 12.8* 14.7  HCT 37.3* 42.5  MCV 90.1 92.0  PLT 159 164   Basic Metabolic Panel  Basename 08/26/12 0116 08/25/12 1542  NA 142 140  K 3.5 4.2  CL 110 105  CO2 20 25  GLUCOSE 119* 101*  BUN 15 17  CREATININE 1.21 1.21  CALCIUM 8.9 9.9  MG -- --  PHOS -- --   Liver Function Tests  Basename 08/25/12 1542  AST 19  ALT 17  ALKPHOS 92  BILITOT 0.3  PROT 7.4  ALBUMIN 3.9   No results found for this basename: LIPASE:2,AMYLASE:2 in the last 72 hours Cardiac Enzymes  Basename 08/26/12 0116 08/25/12 2046  CKTOTAL -- --  CKMB -- --  CKMBINDEX -- --  TROPONINI >20.00* 8.80*   BNP No components found with this basename: POCBNP:3 D-Dimer No results found for this basename: DDIMER:2 in the last  72 hours Hemoglobin A1C No results found for this basename: HGBA1C in the last 72 hours Fasting Lipid Panel  Basename 08/26/12 0116  CHOL 145  HDL 38*  LDLCALC 90  TRIG 84  CHOLHDL 3.8  LDLDIRECT --   Thyroid Function Tests No results found for this basename: TSH,T4TOTAL,FREET3,T3FREE,THYROIDAB in the last 72 hours  TELE  Normal sinus rhythm  ECG  Normal sinus rhythm, evolving inferior lateral MI, ST segment elevation resolved.  Radiology/Studies  No results found.  ASSESSMENT AND PLAN     Patient is stable today after having an acute inferior lateral Carter Kaman MI yesterday. Status post PCI of the circumflex marginal. Echocardiogram pending.  We'll continue current medications and monitor and step down today. Plan discharge Monday. Transfer to telemetry tomorrow.  Signed, Valera Castle MD

## 2012-08-27 MED ORDER — ZOLPIDEM TARTRATE 5 MG PO TABS
5.0000 mg | ORAL_TABLET | Freq: Every evening | ORAL | Status: DC | PRN
  Administered 2012-08-27: 5 mg via ORAL
  Filled 2012-08-27: qty 2

## 2012-08-27 MED ORDER — AMLODIPINE BESYLATE 5 MG PO TABS
5.0000 mg | ORAL_TABLET | Freq: Every day | ORAL | Status: DC
  Administered 2012-08-27: 5 mg via ORAL
  Filled 2012-08-27 (×2): qty 1

## 2012-08-27 NOTE — Progress Notes (Signed)
Called to patient's by another RN; RN stated pt's family came out of room screaming "nurse!"  RN went to room, patient was shaking and incoherent which lasted about 15-20 seconds.  Said patient was disoriented when he came to, but quickly reoriented.  BP elevated at 175/107 otherwise VSS.  Pt and family stated this has never happened before.  About 5 minutes prior to this event, pt called out requesting nurse.  When I arrived to room, pt was inquiring about feeling short of breath when he tries to sleep whether it be when he is laying flat or sitting up.  Said he discussed this information with Dr. Daleen Squibb but issue wasn't properly addressed. Pt and family without any further questions, I left the room and then this new event occurred.  Gene Serpe with cardiology paged and made aware.  He is currently on floor evaluating patient.

## 2012-08-27 NOTE — Progress Notes (Signed)
CTSP re: "shaking" episode. RN note reviewed, and recent episode discussed. Elevated BP noted. Pt seen and examined. Denies any CP, palps, or current SOB or orthopnea. PE benign, no physical evidence of pulmonary edema. I also spoke with family members who witnessed the episode. They suggested that he has been intermittently nodding off, and appeared to have suddenly awakened. They confirmed that he was momentarily "shaking". The patient himself has no recollection of the event.  BP 168/110, currently, with HR 62. Will start amlodipine 5 mg daily, and continue closely monitoring patient for any change in clinical status.

## 2012-08-27 NOTE — Progress Notes (Signed)
Pt transferred to room 3022 via wheelchair with belongings and chart. Pt has no complaints at this time. Report given to Belenda Cruise, Charity fundraiser.  Dawson Bills, RN

## 2012-08-27 NOTE — Progress Notes (Signed)
Patient ID: ALAM GUTERREZ, male   DOB: Nov 01, 1951, 61 y.o.   MRN: [id number]   Patient Name: [name] Date of Encounter: 08/27/2012    SUBJECTIVE  No chest discomfort. Still having trouble sleeping. He feels like he is a bit anxious.   CURRENT MEDS    . aspirin EC  81 mg Oral Daily  . atorvastatin  80 mg Oral q1800  . metoprolol tartrate  12.5 mg Oral BID  . prasugrel  10 mg Oral Daily  . Tamsulosin HCl  0.4 mg Oral Daily  . zolpidem  10 mg Oral Once    OBJECTIVE  Filed Vitals:   08/27/12 0400 08/27/12 0500 08/27/12 0700 08/27/12 0725  BP:  132/86 144/87 145/88  Pulse:  51 55 58  Temp: 98.4 F (36.9 C)   98.7 F (37.1 C)  TempSrc:    Oral  Resp:    15  Height:      Weight:  148 lb 2.4 oz (67.2 kg)    SpO2:  100% 99% 99%    Intake/Output Summary (Last 24 hours) at 08/27/12 0909 Last data filed at 08/27/12 0845  Gross per 24 hour  Intake    300 ml  Output    520 ml  Net   -220 ml   Filed Weights   08/25/12 1601 08/25/12 1751 08/27/12 0500  Weight: 150 lb (68.04 kg) 144 lb 2.9 oz (65.4 kg) 148 lb 2.4 oz (67.2 kg)    PHYSICAL EXAM  General: Pleasant, NAD. Neuro: Alert and oriented X 3. Moves all extremities spontaneously. Psych: Normal affect. HEENT:  Normal  Neck: Supple without bruits or JVD. Lungs:  Resp regular and unlabored, CTA. Heart: RRR no s3, s4, or murmurs. Specifically no mitral regurgitation murmur. Abdomen: Soft, non-tender, non-distended, BS + x 4.  Extremities: No clubbing, cyanosis or edema. DP/PT/Radials 2+ and equal bilaterally. Right groin stable  Accessory Clinical Findings  CBC  Basename 08/26/12 0116 08/25/12 1542  WBC 9.2 9.1  NEUTROABS -- 6.9  HGB 12.8* 14.7  HCT 37.3* 42.5  MCV 90.1 92.0  PLT 159 164   Basic Metabolic Panel  Basename 08/26/12 0116 08/25/12 1542  NA 142 140  K 3.5 4.2  CL 110 105  CO2 20 25  GLUCOSE 119* 101*  BUN 15 17  CREATININE 1.21 1.21  CALCIUM 8.9 9.9  MG -- --  PHOS -- --   Liver  Function Tests  Basename 08/25/12 1542  AST 19  ALT 17  ALKPHOS 92  BILITOT 0.3  PROT 7.4  ALBUMIN 3.9   No results found for this basename: LIPASE:2,AMYLASE:2 in the last 72 hours Cardiac Enzymes  Basename 08/26/12 0854 08/26/12 0116 08/25/12 2046  CKTOTAL -- -- --  CKMB -- -- --  CKMBINDEX -- -- --  TROPONINI >20.00* >20.00* 8.80*   BNP No components found with this basename: POCBNP:3 D-Dimer No results found for this basename: DDIMER:2 in the last 72 hours Hemoglobin A1C No results found for this basename: HGBA1C in the last 72 hours Fasting Lipid Panel  Basename 08/26/12 0116  CHOL 145  HDL 38*  LDLCALC 90  TRIG 84  CHOLHDL 3.8  LDLDIRECT --   Thyroid Function Tests No results found for this basename: TSH,T4TOTAL,FREET3,T3FREE,THYROIDAB in the last 72 hours  TELE  Sinus bradycardia  ECG    Radiology/Studies  No results found.  ASSESSMENT AND PLAN  Active Problems:  Myocardial infarction, inferior, acute, initial episode    We'll transfer to telemetry.  Cardiac rehabilitation. Discharge tomorrow. I have decided not to order echocardiogram since he had good LV function on his catheterization and there is no mitral regurgitation. We'll give Ambien tonight to help him sleep.  Signed, Valera Castle MD

## 2012-08-28 ENCOUNTER — Encounter (HOSPITAL_COMMUNITY)
Admission: EM | Disposition: A | Discharge status: Home / Self Care | Payer: Self-pay | Source: Home / Self Care | Attending: Cardiology

## 2012-08-28 ENCOUNTER — Encounter (HOSPITAL_COMMUNITY): Payer: Self-pay | Admitting: Nurse Practitioner

## 2012-08-28 DIAGNOSIS — I119 Hypertensive heart disease without heart failure: Secondary | ICD-10-CM

## 2012-08-28 DIAGNOSIS — Z720 Tobacco use: Secondary | ICD-10-CM

## 2012-08-28 DIAGNOSIS — I2119 ST elevation (STEMI) myocardial infarction involving other coronary artery of inferior wall: Principal | ICD-10-CM

## 2012-08-28 DIAGNOSIS — I2510 Atherosclerotic heart disease of native coronary artery without angina pectoris: Secondary | ICD-10-CM

## 2012-08-28 SURGERY — LEFT HEART CATHETERIZATION WITH CORONARY ANGIOGRAM
Anesthesia: LOCAL

## 2012-08-28 MED ORDER — PRASUGREL HCL 10 MG PO TABS: 10.0000 mg | ORAL_TABLET | Freq: Every day | ORAL | Status: DC

## 2012-08-28 MED ORDER — ATORVASTATIN CALCIUM 80 MG PO TABS: 80.0000 mg | ORAL_TABLET | Freq: Every day | ORAL | Status: DC

## 2012-08-28 MED ORDER — AMLODIPINE BESYLATE 10 MG PO TABS: 10.0000 mg | ORAL_TABLET | Freq: Every day | ORAL | Status: DC

## 2012-08-28 MED ORDER — METOPROLOL TARTRATE 25 MG PO TABS: 12.5000 mg | ORAL_TABLET | Freq: Two times a day (BID) | ORAL | Status: DC

## 2012-08-28 MED ORDER — BENZOCAINE 10 % MT GEL
Freq: Two times a day (BID) | OROMUCOSAL | Status: DC | PRN
  Filled 2012-08-28: qty 9.4

## 2012-08-28 MED ORDER — NITROGLYCERIN 0.4 MG SL SUBL: 0.4000 mg | SUBLINGUAL_TABLET | SUBLINGUAL | Status: DC | PRN

## 2012-08-28 MED ORDER — ASPIRIN 81 MG PO TBEC: 81.0000 mg | DELAYED_RELEASE_TABLET | Freq: Every day | ORAL | Status: AC

## 2012-08-28 MED ORDER — BENZOCAINE 10 % MT GEL: Freq: Two times a day (BID) | OROMUCOSAL | Status: DC | PRN

## 2012-08-28 MED ORDER — AMLODIPINE BESYLATE 10 MG PO TABS
10.0000 mg | ORAL_TABLET | Freq: Every day | ORAL | Status: DC
  Administered 2012-08-28: 10 mg via ORAL
  Filled 2012-08-28: qty 1

## 2012-08-28 MED FILL — Dextrose Inj 5%: INTRAVENOUS | Qty: 50 | Status: AC

## 2012-08-28 NOTE — Progress Notes (Signed)
Patient Name: [name] Date of Encounter: 08/28/2012   Principal Problem:  *Myocardial infarction, inferior, acute, initial episode Active Problems:  CAD (coronary artery disease)  Hypertension  Tobacco abuse  SUBJECTIVE  No chest pain or sob.  Ambulating without difficulty.  Eager to go home.  CURRENT MEDS    . amLODipine  5 mg Oral Daily  . aspirin EC  81 mg Oral Daily  . atorvastatin  80 mg Oral q1800  . metoprolol tartrate  12.5 mg Oral BID  . prasugrel  10 mg Oral Daily  . Tamsulosin HCl  0.4 mg Oral Daily  . zolpidem  10 mg Oral Once   OBJECTIVE  Filed Vitals:   08/27/12 1746 08/27/12 2100 08/27/12 2132 08/28/12 0500  BP: 183/108 130/82 168/128 153/93  Pulse:  60 58 56  Temp:  98.6 F (37 C)  98.4 F (36.9 C)  TempSrc:  Oral  Oral  Resp:  18  18  Height:      Weight:      SpO2:  98%  95%    Intake/Output Summary (Last 24 hours) at 08/28/12 0913 Last data filed at 08/27/12 2100  Gross per 24 hour  Intake    480 ml  Output      0 ml  Net    480 ml   Filed Weights   08/25/12 1601 08/25/12 1751 08/27/12 0500  Weight: 150 lb (68.04 kg) 144 lb 2.9 oz (65.4 kg) 148 lb 2.4 oz (67.2 kg)   PHYSICAL EXAM  General: Pleasant, NAD. Neuro: Alert and oriented X 3. Moves all extremities spontaneously. Psych: Normal affect. HEENT:  Normal  Neck: Supple without bruits or JVD. Lungs:  Resp regular and unlabored, CTA. Heart: RRR no s3, s4, or murmurs. Abdomen: Soft, non-tender, non-distended, BS + x 4.  Extremities: No clubbing, cyanosis or edema. DP/PT/Radials 2+ and equal bilaterally.  R groin cath site w/o bleeding/bruit/hematoma.  Accessory Clinical Findings  CBC  Basename 08/26/12 0116 08/25/12 1542  WBC 9.2 9.1  NEUTROABS -- 6.9  HGB 12.8* 14.7  HCT 37.3* 42.5  MCV 90.1 92.0  PLT 159 164   Basic Metabolic Panel  Basename 08/26/12 0116 08/25/12 1542  NA 142 140  K 3.5 4.2  CL 110 105  CO2 20 25  GLUCOSE 119* 101*  BUN 15 17    CREATININE 1.21 1.21  CALCIUM 8.9 9.9  MG -- --  PHOS -- --   Liver Function Tests  Basename 08/25/12 1542  AST 19  ALT 17  ALKPHOS 92  BILITOT 0.3  PROT 7.4  ALBUMIN 3.9   Cardiac Enzymes  Basename 08/26/12 0854 08/26/12 0116 08/25/12 2046  CKTOTAL -- -- --  CKMB -- -- --  CKMBINDEX -- -- --  TROPONINI >20.00* >20.00* 8.80*   Fasting Lipid Panel  Basename 08/26/12 0116  CHOL 145  HDL 38*  LDLCALC 90  TRIG 84  CHOLHDL 3.8  LDLDIRECT --   TELE  Rsr/sb  Radiology/Studies  No results found.  ASSESSMENT AND PLAN  1.  Acute inferolateral STEMI/CAD:  S/p PCI/DES to the OM.  No chest pain.  Ambulating w/o difficulty.  Seen by cardiac rehab over the weekend with referral made to outpt rehab @ [facility].  Cont asa, statin, low-dose bb, effient.  2.  HTN:  Amlodipine started yesterday.  Pressures still up.  Will titrate to 10mg  daily.  Cont low-dose bb - baseline relative bradycardia will limit additional titration of bb.  Consider ACEI given #  1.  3.  HL:  LDL 90.  Lipitor 80 started in setting of #1.  4.  Tob Abuse:  Cessation advised.  He does not plan on quitting.  He has a long h/o allergies and what sounds like reactive airway dzs, which he believes is greatly helped by smoking Kool menthol cigarettes.  Says he was told by his allergist, Dr. Corinda Gubler, to continue smoking b/c cigarettes were better for him than steroids.  5. ? Sleep apnea:  Pt reports having episodes of nodding off followed by the sensation that he can't breathe and sudden awakening with anxiety.  Rec pcp f/u with referral for sleep study.  6.  Dispo:  Ambulate this AM.  Prob d/c later this AM.  F/u in [place] in 7 days.  Signed, Nicolasa Ducking NP  I personally saw and examined the patient.  I reviewed his issues with him.  I told him no driving for two weeeks, and no sex for six weeks.  He should follow up in our [facility] clinic.  We reviewed the reasons for his medications.  His BP is slowly  coming down.  Ashby Dawes of his hospitalization explained.   Early follow up in [place] recommended.  Patient agreeable.

## 2012-08-28 NOTE — Progress Notes (Signed)
CARDIAC REHAB PHASE I   PRE:  Rate/Rhythm: 59 SB    BP: sitting left 170/110, right 166/110 (had been up cleaning up)    SaO2:   MODE:  Ambulation: 1100 ft   POST:  Rate/Rhythm: 69 SR    BP: sitting 154/120     SaO2:   BP elevated. No meds yet. No c/o. Slow, easy walk without c/o. DBP higher after walk (manually taken). Discussed importance of taking meds. Pt sts he will take meds. Gave smoking cessation resource. Doesn't seem interested in quitting.  [id number]  Harriet Masson CES, ACSM

## 2012-08-28 NOTE — Care Management Note (Signed)
    Page 1 of 1   08/28/2012     3:28:50 PM   CARE MANAGEMENT NOTE 08/28/2012  Patient:  [name], [name]   Account Number:  [id number]  Date Initiated:  08/28/2012  Documentation initiated by:  Sandoval,Timothy Haden  Subjective/Objective Assessment:   Pt admitted with cp. MI- Plan for home today with effient. CM will provide pt with 30 day free effient card and [facility] patient assistance forms. PA to fill them out and pt to fax information in to co.     Action/Plan:   CM did call [facility] pharmacy and medication is available. MD please write original rx for effient with refills also. Thanks   Anticipated DC Date:  08/28/2012   Anticipated DC Plan:  HOME/SELF CARE      DC Planning Services  CM consult      Choice offered to / List presented to:             Status of service:  Completed, signed off Medicare Important Message given?   (If response is "NO", the following Medicare IM given date fields will be blank) Date Medicare IM given:   Date Additional Medicare IM given:    Discharge Disposition:  HOME/SELF CARE  Per UR Regulation:  Reviewed for med. necessity/level of care/duration of stay  If discussed at Long Length of Stay Meetings, dates discussed:    Comments:

## 2012-08-28 NOTE — Discharge Summary (Signed)
Patient ID: [name],  MRN: [id number], DOB/AGE: 1951/10/07 61 y.o.  Admit date: 08/25/2012 Discharge date: 08/28/2012  Primary Care Provider: HAWKINS,EDWARD L Primary Cardiologist: Pt will f/u @ [facility]  Discharge Diagnoses Principal Problem:  *Myocardial infarction, inferior, acute, initial episode  **S/P PCI/DES of the Obtuse Marginal this admission. Active Problems:  CAD (coronary artery disease)  Hypertension  Tobacco abuse  Allergies Allergies  Allergen Reactions  . Sulfa Antibiotics Hives   Procedures  Cardiac Catheterization and Percutaneous Coronary Intervention 08/25/2012  Hemodynamic Findings: Central aortic pressure: 103/76 Left ventricular pressure: 114/4/11  Angiographic Findings:  Left main:  No obstructive disease.  Left Anterior Descending Artery: Large caliber vessel that courses to the apex. There are mild luminal irregularities in the mid vessel. The diagonal branch is moderate sized and has no obstructive disease.  Ramus Intermediate: Large caliber vessel with serial 20% stenoses proximal and mid vessel.   Circumflex Artery:  Large caliber vessel that terminates into a moderate sized obtuse marginal branch. The distal portion of the marginal branch is completely occluded.     **The Distal OM branch was successfully stented with a 2.75 x 15mm Promus drug-eluting stent**  Right Coronary Artery: Small, non-dominant vessel with mild plaque.  Left Ventricular Angiogram: LVEF=60-65%.  History of Present Illness  61 y/o male without prior cardiac history.  He was in his USOH until approximately 2 wks prior to admission when he began to experience intermittent chest discomfort.  He had a recurrent episode on the day of admission.  He went to a local store where he had a syncopal spell.  EMS was called and he was taken to [facility] [place], [place].  There, ECG showed LVH with repolarization changes along with PVC's.  He was placed on  IV heparin and NTG and transported to [facility] for further evaluation.  Hospital Course  Upon arrival to [facility], pt continued to have chest pain despite titration of IV NTG.  Decision was made to pursue diagnostic catheterization.  This revealed an occlusion of the distal portion of the Obtuse Marginal branch of the Left Circumflex.  He otherwise had nonobstructive disease and normal LV function.  The occlusion of the OM was felt to be the infarct vessel and this was successfully stented using a 2.75 x 15mm Promus DES.  Pt tolerated procedure well and was monitored in the Coronary Intensive Care Unit for additional MI care.  His troponin rose to > 20.  He had no additional chest pain and was evaluated by cardiac rehab prior to being transferred out to the floor on Sunday 10/13.  Following transfer to the floor, pt had an episode of dyspnea, anxiety, and shaking that occurred after nodding off while in bed and then awakening suddenly.  He was hemodynamically stable and no events were noted on telemetry.  He reported that similar episodes have occurred at home, always in the setting of nodding off to sleep.  It was felt that these symptoms may represent sleep apnea and he has been encouraged to f/u with his PCP for sleep study eval.  He will otherwise be discharged home today in good condition.  Discharge Vitals Blood pressure 153/93, pulse 56, temperature 98.4 F (36.9 C), temperature source Oral, resp. rate 18, height 5\' 7"  (1.702 m), weight 148 lb 2.4 oz (67.2 kg), SpO2 95.00%.  Filed Weights   08/25/12 1601 08/25/12 1751 08/27/12 0500  Weight: 150 lb (68.04 kg) 144 lb 2.9 oz (65.4 kg) 148  lb 2.4 oz (67.2 kg)   Labs  CBC  Basename 08/26/12 0116 08/25/12 1542  WBC 9.2 9.1  NEUTROABS -- 6.9  HGB 12.8* 14.7  HCT 37.3* 42.5  MCV 90.1 92.0  PLT 159 164   Basic Metabolic Panel  Basename 08/26/12 0116 08/25/12 1542  NA 142 140  K 3.5 4.2  CL 110 105  CO2 20 25  GLUCOSE 119* 101*  BUN  15 17  CREATININE 1.21 1.21  CALCIUM 8.9 9.9  MG -- --  PHOS -- --   Liver Function Tests  Basename 08/25/12 1542  AST 19  ALT 17  ALKPHOS 92  BILITOT 0.3  PROT 7.4  ALBUMIN 3.9   Cardiac Enzymes  Basename 08/26/12 0854 08/26/12 0116 08/25/12 2046  CKTOTAL -- -- --  CKMB -- -- --  CKMBINDEX -- -- --  TROPONINI >20.00* >20.00* 8.80*   Fasting Lipid Panel  Basename 08/26/12 0116  CHOL 145  HDL 38*  LDLCALC 90  TRIG 84  CHOLHDL 3.8  LDLDIRECT --   Disposition  Pt is being discharged home today in good condition.  Follow-up Plans & Appointments      Follow-up Information    Follow up with Lewayne Bunting, MD. On 09/04/2012. (2:00 PM)    Contact information:   [facility] [place] [address] [place], [place] [address] [phone]       Follow up with HAWKINS,EDWARD L, MD. (as scheduled.)    Contact information:   [address] [place] [address] [phone]        Discharge Medications    Medication List     As of 08/28/2012  2:17 PM    TAKE these medications         amLODipine 10 MG tablet   Commonly known as: NORVASC   Take 1 tablet (10 mg total) by mouth daily.      aspirin 81 MG EC tablet   Take 1 tablet (81 mg total) by mouth daily.      atorvastatin 80 MG tablet   Commonly known as: LIPITOR   Take 1 tablet (80 mg total) by mouth daily at 6 PM.      benzocaine 10 % mucosal gel   Commonly known as: ORAJEL   Use as directed in the mouth or throat 2 (two) times daily as needed for pain.      bisacodyl 10 MG suppository   Commonly known as: DULCOLAX   Place 10 mg rectally as needed.      GAS-X EXTRA STRENGTH 125 MG Caps   Generic drug: Simethicone   Take 1-2 capsules by mouth as needed.      metoprolol tartrate 25 MG tablet   Commonly known as: LOPRESSOR   Take 0.5 tablets (12.5 mg total) by mouth 2 (two) times daily.      nitroGLYCERIN 0.4 MG SL tablet   Commonly known as: NITROSTAT   Place 1  tablet (0.4 mg total) under the tongue every 5 (five) minutes x 3 doses as needed for chest pain.      prasugrel 10 MG Tabs   Commonly known as: EFFIENT   Take 1 tablet (10 mg total) by mouth daily.      promethazine 25 MG tablet   Commonly known as: PHENERGAN   Take 1 tablet (25 mg total) by mouth every 6 (six) hours as needed for nausea.      Tamsulosin HCl 0.4 MG Caps   Commonly known  as: FLOMAX   Take 1 capsule (0.4 mg total) by mouth daily.       Outstanding Labs/Studies  F/U Lipids/LFT's in 6-8 wks (new statin Rx)  Duration of Discharge Encounter   Greater than 30 minutes including physician time.  Signed, Nicolasa Ducking NP 08/28/2012, 2:17 PM

## 2012-08-29 LAB — POCT ACTIVATED CLOTTING TIME: Activated Clotting Time: 419 seconds

## 2012-09-04 ENCOUNTER — Encounter: Payer: Self-pay | Admitting: Internal Medicine

## 2012-09-04 ENCOUNTER — Ambulatory Visit (INDEPENDENT_AMBULATORY_CARE_PROVIDER_SITE_OTHER): Payer: No Typology Code available for payment source | Admitting: Internal Medicine

## 2012-09-04 VITALS — BP 174/120 | HR 79 | Ht 67.0 in | Wt 147.1 lb

## 2012-09-04 DIAGNOSIS — Z720 Tobacco use: Secondary | ICD-10-CM

## 2012-09-04 DIAGNOSIS — I10 Essential (primary) hypertension: Secondary | ICD-10-CM

## 2012-09-04 DIAGNOSIS — I2119 ST elevation (STEMI) myocardial infarction involving other coronary artery of inferior wall: Secondary | ICD-10-CM

## 2012-09-04 DIAGNOSIS — F17200 Nicotine dependence, unspecified, uncomplicated: Secondary | ICD-10-CM

## 2012-09-04 MED ORDER — CARVEDILOL 6.25 MG PO TABS: 6.2500 mg | ORAL_TABLET | Freq: Two times a day (BID) | ORAL | Status: DC

## 2012-09-04 NOTE — Progress Notes (Signed)
HPI [name] returns today for followup. He is a pleasant middle aged man with a h/o HTN, CAD, dyslipidemia, s/p recent PCI, stenting who returns today for followup. He denies chest pain or sob. No edema. No syncope.  Allergies  Allergen Reactions  . Sulfa Antibiotics Hives     Current Outpatient Prescriptions  Medication Sig Dispense Refill  . amLODipine (NORVASC) 10 MG tablet Take 1 tablet (10 mg total) by mouth daily.  30 tablet  6  . aspirin EC 81 MG EC tablet Take 1 tablet (81 mg total) by mouth daily.      Marland Kitchen atorvastatin (LIPITOR) 80 MG tablet Take 1 tablet (80 mg total) by mouth daily at 6 PM.  30 tablet  6  . bisacodyl (DULCOLAX) 10 MG suppository Place 10 mg rectally as needed.      . nitroGLYCERIN (NITROSTAT) 0.4 MG SL tablet Place 1 tablet (0.4 mg total) under the tongue every 5 (five) minutes x 3 doses as needed for chest pain.  25 tablet  3  . prasugrel (EFFIENT) 10 MG TABS Take 1 tablet (10 mg total) by mouth daily.  30 tablet  6  . promethazine (PHENERGAN) 25 MG tablet Take 1 tablet (25 mg total) by mouth every 6 (six) hours as needed for nausea.  15 tablet  0  . Simethicone (GAS-X EXTRA STRENGTH) 125 MG CAPS Take 1-2 capsules by mouth as needed.      . Tamsulosin HCl (FLOMAX) 0.4 MG CAPS Take 1 capsule (0.4 mg total) by mouth daily.  4 capsule  0  . carvedilol (COREG) 6.25 MG tablet Take 1 tablet (6.25 mg total) by mouth 2 (two) times daily.  60 tablet  6     Past Medical History  Diagnosis Date  . CAD (coronary artery disease)     a. 08/2012 Inferolateral STEMI/Cath/PCI: LM nl, LAD minor irregs, RI 20p, LCX nl, OM1 100d (Tx w 2.75x42mm Promus DES), RCA nondom, minor irregs, EF60-65%.  . Hypertension   . Tobacco abuse   . Environmental allergies     ROS:   All systems reviewed and negative except as noted in the HPI.   Past Surgical History  Procedure Date  . Cholecystectomy   . Appendectomy      No family history on file.   History   Social History    . Marital Status: Single    Spouse Name: N/A    Number of Children: N/A  . Years of Education: N/A   Occupational History  . Not on file.   Social History Main Topics  . Smoking status: Current Every Day Smoker -- 0.3 packs/day  . Smokeless tobacco: Not on file   Comment: Patient is still smoking everday but he is trying to quit.   . Alcohol Use: No  . Drug Use: No  . Sexually Active: Not on file   Other Topics Concern  . Not on file   Social History Narrative  . No narrative on file     BP 174/120  Pulse 79  Ht 5\' 7"  (1.702 m)  Wt 147 lb 1.9 oz (66.733 kg)  BMI 23.04 kg/m2  SpO2 100%  Physical Exam:  Well appearing middle aged man, NAD HEENT: Unremarkable Neck:  7 cm JVD, no thyromegally Lungs:  Clear with no wheezes, rales, or rhonchi HEART:  Regular rate rhythm, no murmurs, no rubs, no clicks Abd:  soft, positive bowel sounds, no organomegally, no rebound, no guarding Ext:  2 plus pulses, no  edema, no cyanosis, no clubbing Skin:  No rashes no nodules Neuro:  CN II through XII intact, motor grossly intact   DEVICE  Normal device function.  See PaceArt for details.   Assess/Plan:

## 2012-09-04 NOTE — Patient Instructions (Addendum)
Your physician recommends that you schedule a follow-up appointment in: 4 months  Your physician has recommended you make the following change in your medication:  1 - STOP Metoprolol 2 - START Carvedilol 6.25 mg twice a day

## 2012-09-04 NOTE — Assessment & Plan Note (Signed)
His blood pressure remains elevated. I have asked the patient to stop his metoprolol and start low dose carvedilol.

## 2012-09-04 NOTE — Assessment & Plan Note (Signed)
He is strongly encouraged to stop smoking. He is trying.

## 2012-09-04 NOTE — Assessment & Plan Note (Signed)
He denies anginal symptoms. Will continue to his current meds.

## 2012-09-09 NOTE — Discharge Summary (Signed)
See daily progress note.  I personally evaluated the patient and supervised the care.  More than thirty minutes of dc time.

## 2012-12-21 ENCOUNTER — Ambulatory Visit (INDEPENDENT_AMBULATORY_CARE_PROVIDER_SITE_OTHER): Payer: No Typology Code available for payment source | Admitting: Internal Medicine

## 2012-12-21 ENCOUNTER — Encounter: Payer: Self-pay | Admitting: Internal Medicine

## 2012-12-21 VITALS — BP 223/132 | HR 52 | Wt 155.0 lb

## 2012-12-21 DIAGNOSIS — I2510 Atherosclerotic heart disease of native coronary artery without angina pectoris: Secondary | ICD-10-CM

## 2012-12-21 DIAGNOSIS — I10 Essential (primary) hypertension: Secondary | ICD-10-CM

## 2012-12-21 DIAGNOSIS — F17200 Nicotine dependence, unspecified, uncomplicated: Secondary | ICD-10-CM

## 2012-12-21 DIAGNOSIS — Z720 Tobacco use: Secondary | ICD-10-CM

## 2012-12-21 MED ORDER — CARVEDILOL 12.5 MG PO TABS: 12.5000 mg | ORAL_TABLET | Freq: Two times a day (BID) | ORAL | Status: DC

## 2012-12-21 NOTE — Progress Notes (Signed)
HPI [name] returns today for followup. He is a very pleasant 62 year old man with coronary disease status post MI, hypertension, and dyslipidemia. He has had problems with bradycardia which has limited his medical therapy. When I saw him last, we were in the process of down titrate his metoprolol and up titrate his carvedilol. He returns today for followup. He has had severe pain in his, and his blood pressure is markedly elevated today. He denies chest pain, shortness of breath, headache, or visual changes. Allergies  Allergen Reactions  . Sulfa Antibiotics Hives     Current Outpatient Prescriptions  Medication Sig Dispense Refill  . amLODipine (NORVASC) 10 MG tablet Take 1 tablet (10 mg total) by mouth daily.  30 tablet  6  . aspirin EC 81 MG EC tablet Take 1 tablet (81 mg total) by mouth daily.      Marland Kitchen atorvastatin (LIPITOR) 80 MG tablet Take 1 tablet (80 mg total) by mouth daily at 6 PM.  30 tablet  6  . bisacodyl (DULCOLAX) 10 MG suppository Place 10 mg rectally as needed.      . carvedilol (COREG) 12.5 MG tablet Take 1 tablet (12.5 mg total) by mouth 2 (two) times daily.  60 tablet  6  . nitroGLYCERIN (NITROSTAT) 0.4 MG SL tablet Place 1 tablet (0.4 mg total) under the tongue every 5 (five) minutes x 3 doses as needed for chest pain.  25 tablet  3  . prasugrel (EFFIENT) 10 MG TABS Take 1 tablet (10 mg total) by mouth daily.  30 tablet  6  . Simethicone (GAS-X EXTRA STRENGTH) 125 MG CAPS Take 1-2 capsules by mouth as needed.      . Tamsulosin HCl (FLOMAX) 0.4 MG CAPS Take 1 capsule (0.4 mg total) by mouth daily.  4 capsule  0  . promethazine (PHENERGAN) 25 MG tablet Take 1 tablet (25 mg total) by mouth every 6 (six) hours as needed for nausea.  15 tablet  0     Past Medical History  Diagnosis Date  . CAD (coronary artery disease)     a. 08/2012 Inferolateral STEMI/Cath/PCI: LM nl, LAD minor irregs, RI 20p, LCX nl, OM1 100d (Tx w 2.75x76mm Promus DES), RCA nondom, minor irregs,  EF60-65%.  . Hypertension   . Tobacco abuse   . Environmental allergies     ROS:   All systems reviewed and negative except as noted in the HPI.   Past Surgical History  Procedure Date  . Cholecystectomy   . Appendectomy      No family history on file.   History   Social History  . Marital Status: Single    Spouse Name: N/A    Number of Children: N/A  . Years of Education: N/A   Occupational History  . Not on file.   Social History Main Topics  . Smoking status: Current Every Day Smoker -- 0.3 packs/day  . Smokeless tobacco: Not on file     Comment: Patient is still smoking everday but he is trying to quit.   . Alcohol Use: No  . Drug Use: No  . Sexually Active: Not on file   Other Topics Concern  . Not on file   Social History Narrative  . No narrative on file     BP 223/132  Pulse 52  Wt 155 lb (70.308 kg) Blood pressure 215/115 on my exam Physical Exam:  Well appearing NAD HEENT: Unremarkable, careful ophthalmologic exam demonstrates no papilledema, but AV nicking is present.  Neck:  7 cm JVD, no thyromegally Lungs:  Clear with no wheezes, rales, or rhonchi. HEART:  Regular rate rhythm, no murmurs, no rubs, no clicks Abd:  soft, positive bowel sounds, no organomegally, no rebound, no guarding Ext:  2 plus pulses, no edema, no cyanosis, no clubbing Skin:  No rashes no nodules Neuro:  CN II through XII intact, motor grossly intact  EKG  DEVICE  Normal device function.  See PaceArt for details.   Assess/Plan:

## 2012-12-21 NOTE — Assessment & Plan Note (Signed)
He continues to be encouraged to stop smoking.

## 2012-12-21 NOTE — Patient Instructions (Addendum)
Your physician recommends that you schedule a follow-up appointment in: 2 months  Your physician has recommended you make the following change in your medication:  1 - Take an extra coreg when you get home then 2 tonight 2 - Starting tomorrow your dose of Coreg will be 12.5 mg twice a day 3 - STOP Metoprolol

## 2012-12-21 NOTE — Assessment & Plan Note (Signed)
Despite his poorly controlled blood pressure and ongoing tobacco abuse, he denies chest pain.

## 2012-12-21 NOTE — Assessment & Plan Note (Signed)
His blood pressure is not controlled today. I've asked the patient to increase his carvedilol to 12.5 mg twice a day and discontinue his metoprolol. We have been limited with beta blockers because of underlying sinus bradycardia. My hope is that we can continue to up titrate his carvedilol. He is instructed to maintain a low-sodium diet. If he develops chest pain, headache, or vision changes, he is instructed to go to the emergency room.

## 2013-01-29 ENCOUNTER — Other Ambulatory Visit: Payer: Self-pay | Admitting: Neurology

## 2013-01-29 DIAGNOSIS — D329 Benign neoplasm of meninges, unspecified: Secondary | ICD-10-CM

## 2013-02-01 ENCOUNTER — Ambulatory Visit (HOSPITAL_COMMUNITY)
Admission: RE | Admit: 2013-02-01 | Discharge: 2013-02-01 | Disposition: A | Discharge status: Home / Self Care | Payer: Self-pay | Source: Ambulatory Visit | Attending: Neurology | Admitting: Neurology

## 2013-02-01 DIAGNOSIS — D329 Benign neoplasm of meninges, unspecified: Secondary | ICD-10-CM

## 2013-02-01 DIAGNOSIS — D320 Benign neoplasm of cerebral meninges: Secondary | ICD-10-CM | POA: Insufficient documentation

## 2013-02-01 LAB — POCT I-STAT, CHEM 8
Chloride: 114 mEq/L — ABNORMAL HIGH (ref 96–112)
HCT: 44 % (ref 39.0–52.0)
Potassium: 4 mEq/L (ref 3.5–5.1)
Sodium: 144 mEq/L (ref 135–145)

## 2013-02-01 MED ORDER — GADOBENATE DIMEGLUMINE 529 MG/ML IV SOLN
14.0000 mL | Freq: Once | INTRAVENOUS | Status: AC | PRN
  Administered 2013-02-01: 14 mL via INTRAVENOUS

## 2013-02-01 NOTE — Progress Notes (Signed)
Blood sample obtained from left arm IV for Creatnine level.  

## 2013-03-05 ENCOUNTER — Encounter: Payer: Self-pay | Admitting: Internal Medicine

## 2013-03-05 ENCOUNTER — Ambulatory Visit (INDEPENDENT_AMBULATORY_CARE_PROVIDER_SITE_OTHER): Payer: No Typology Code available for payment source | Admitting: Internal Medicine

## 2013-03-05 VITALS — BP 178/110 | HR 55 | Ht 67.0 in | Wt 155.0 lb

## 2013-03-05 DIAGNOSIS — I10 Essential (primary) hypertension: Secondary | ICD-10-CM

## 2013-03-05 DIAGNOSIS — I25810 Atherosclerosis of coronary artery bypass graft(s) without angina pectoris: Secondary | ICD-10-CM

## 2013-03-05 MED ORDER — CARVEDILOL 12.5 MG PO TABS: ORAL_TABLET | ORAL | Status: DC

## 2013-03-05 NOTE — Assessment & Plan Note (Signed)
He denies anginal symptoms. He'll continue his current medications. 

## 2013-03-05 NOTE — Assessment & Plan Note (Signed)
He is down to less than a pack a day of cigarettes. I discuss importance of stopping smoking.

## 2013-03-05 NOTE — Patient Instructions (Addendum)
Your physician has recommended you make the following change in your medication: Increase carvedilol to 1 1/2 tablet daily  Your physician wants you to follow-up in: 6 months with Dr. Ladona Ridgel. You will receive a reminder letter in the mail two months in advance. If you don't receive a letter, please call our office to schedule the follow-up appointment.

## 2013-03-05 NOTE — Assessment & Plan Note (Signed)
His blood pressure is improved. Today we will uptitrate his carvedilol to 18.375 mg twice daily. I've encouraged him to maintain a low-sodium diet.

## 2013-03-05 NOTE — Progress Notes (Signed)
HPI [name] returns today for followup. He is a very pleasant 62 year old man with ischemic heart disease, status post MI, status post stenting of the right coronary artery several months ago. When I saw the patient last several weeks ago, his blood pressure was elevated. Because of bradycardia, he was unable to take much metoprolol. We switched him to carvedilol. In addition, the patient continues to smoke cigarettes. He is trying to reduce his tobacco consumption. His only other complaint today is that of some erectile dysfunction. Allergies  Allergen Reactions  . Sulfa Antibiotics Hives     Current Outpatient Prescriptions  Medication Sig Dispense Refill  . aspirin EC 81 MG EC tablet Take 1 tablet (81 mg total) by mouth daily.      Marland Kitchen atorvastatin (LIPITOR) 80 MG tablet Take 1 tablet (80 mg total) by mouth daily at 6 PM.  30 tablet  6  . carvedilol (COREG) 12.5 MG tablet Take 1 tablet (12.5 mg total) by mouth 2 (two) times daily.  60 tablet  6  . nitroGLYCERIN (NITROSTAT) 0.4 MG SL tablet Place 1 tablet (0.4 mg total) under the tongue every 5 (five) minutes x 3 doses as needed for chest pain.  25 tablet  3  . prasugrel (EFFIENT) 10 MG TABS Take 1 tablet (10 mg total) by mouth daily.  30 tablet  6  . amLODipine (NORVASC) 10 MG tablet Take 1 tablet (10 mg total) by mouth daily.  30 tablet  6  . bisacodyl (DULCOLAX) 10 MG suppository Place 10 mg rectally as needed.      . promethazine (PHENERGAN) 25 MG tablet Take 1 tablet (25 mg total) by mouth every 6 (six) hours as needed for nausea.  15 tablet  0  . Simethicone (GAS-X EXTRA STRENGTH) 125 MG CAPS Take 1-2 capsules by mouth as needed.      . Tamsulosin HCl (FLOMAX) 0.4 MG CAPS Take 1 capsule (0.4 mg total) by mouth daily.  4 capsule  0   No current facility-administered medications for this visit.     Past Medical History  Diagnosis Date  . CAD (coronary artery disease)     a. 08/2012 Inferolateral STEMI/Cath/PCI: LM nl, LAD minor  irregs, RI 20p, LCX nl, OM1 100d (Tx w 2.75x30mm Promus DES), RCA nondom, minor irregs, EF60-65%.  . Hypertension   . Tobacco abuse   . Environmental allergies     ROS:   All systems reviewed and negative except as noted in the HPI.   Past Surgical History  Procedure Laterality Date  . Cholecystectomy    . Appendectomy       No family history on file.   History   Social History  . Marital Status: Married    Spouse Name: N/A    Number of Children: N/A  . Years of Education: N/A   Occupational History  . Not on file.   Social History Main Topics  . Smoking status: Current Every Day Smoker -- 0.30 packs/day  . Smokeless tobacco: Not on file     Comment: Patient is still smoking everday but he is trying to quit.   . Alcohol Use: No  . Drug Use: No  . Sexually Active: Not on file   Other Topics Concern  . Not on file   Social History Narrative  . No narrative on file     Blood pressure 170/110, pulse 54, respiration 18 Physical Exam:  Well appearing middle-aged man,NAD HEENT: Unremarkable Neck:  no JVD, no thyromegally  Back:  No CVA tenderness Lungs:  Clear with no wheezes, rales, or rhonchi. HEART:  Regular rate rhythm, no murmurs, no rubs, no clicks Abd:  soft, positive bowel sounds, no organomegally, no rebound, no guarding Ext:  2 plus pulses, no edema, no cyanosis, no clubbing Skin:  No rashes no nodules Neuro:  CN II through XII intact, motor grossly intact  EKG Sinus bradycardia with left ventricular hypertrophy  Assess/Plan:

## 2013-10-22 ENCOUNTER — Ambulatory Visit: Payer: No Typology Code available for payment source | Admitting: Internal Medicine

## 2013-11-22 ENCOUNTER — Telehealth: Payer: Self-pay | Admitting: Internal Medicine

## 2013-11-22 ENCOUNTER — Encounter: Payer: Self-pay | Admitting: Internal Medicine

## 2013-11-22 ENCOUNTER — Ambulatory Visit (INDEPENDENT_AMBULATORY_CARE_PROVIDER_SITE_OTHER): Payer: Self-pay | Admitting: Internal Medicine

## 2013-11-22 VITALS — BP 219/120 | HR 56 | Ht 67.0 in | Wt 152.0 lb

## 2013-11-22 DIAGNOSIS — I10 Essential (primary) hypertension: Secondary | ICD-10-CM

## 2013-11-22 DIAGNOSIS — F17200 Nicotine dependence, unspecified, uncomplicated: Secondary | ICD-10-CM

## 2013-11-22 DIAGNOSIS — I2510 Atherosclerotic heart disease of native coronary artery without angina pectoris: Secondary | ICD-10-CM

## 2013-11-22 DIAGNOSIS — Z720 Tobacco use: Secondary | ICD-10-CM

## 2013-11-22 MED ORDER — CARVEDILOL 25 MG PO TABS: 25.0000 mg | ORAL_TABLET | Freq: Two times a day (BID) | ORAL | Status: DC

## 2013-11-22 MED ORDER — ATORVASTATIN CALCIUM 80 MG PO TABS: 80.0000 mg | ORAL_TABLET | Freq: Every day | ORAL | Status: DC

## 2013-11-22 NOTE — Assessment & Plan Note (Signed)
His blood pressure remains difficult to control in part because of noncompliance. I've asked the patient to increase his carvedilol from 18.375 mg twice daily to 25 mg twice daily. He will likely require additional antihypertensive medications in the future. The importance of medical compliance was stressed. I've asked the patient to maintain a low-salt diet.

## 2013-11-22 NOTE — Telephone Encounter (Signed)
Received fax refill request  Rx # [id number] Medication:  Atorvastatin Calcium 80 mg tab  Qty 30  Sig:  Take one tbalet by mouth daily at 6:00pm Physician:  Lovena Le

## 2013-11-22 NOTE — Assessment & Plan Note (Signed)
The patient continues to smoke a half a pack of cigarettes daily. I've encouraged him not to smoke.

## 2013-11-22 NOTE — Assessment & Plan Note (Signed)
He denies anginal symptoms. He will continue his current medications. 

## 2013-11-22 NOTE — Progress Notes (Signed)
HPI [name] returns today for followup. He is a very pleasant 63 year old man with ischemic heart disease, status post MI, status post stenting of the right coronary artery over a year ago. When I saw the patient last several months ago, his blood pressure was elevated. Because of bradycardia, he was unable to take much metoprolol. We switched him to carvedilol. In addition, the patient continues to smoke cigarettes. He is trying to reduce his tobacco consumption. His only other complaint today is that of some erectile dysfunction. Today, he admits to running out of his carvedilol. Allergies  Allergen Reactions  . Sulfa Antibiotics Hives     Current Outpatient Prescriptions  Medication Sig Dispense Refill  . aspirin EC 81 MG EC tablet Take 1 tablet (81 mg total) by mouth daily.      . carvedilol (COREG) 25 MG tablet Take 1 tablet (25 mg total) by mouth 2 (two) times daily with a meal.  90 tablet  6  . nitroGLYCERIN (NITROSTAT) 0.4 MG SL tablet Place 1 tablet (0.4 mg total) under the tongue every 5 (five) minutes x 3 doses as needed for chest pain.  25 tablet  3  . prasugrel (EFFIENT) 10 MG TABS Take 1 tablet (10 mg total) by mouth daily.  30 tablet  6  . promethazine (PHENERGAN) 25 MG tablet Take 1 tablet (25 mg total) by mouth every 6 (six) hours as needed for nausea.  15 tablet  0   No current facility-administered medications for this visit.     Past Medical History  Diagnosis Date  . CAD (coronary artery disease)     a. 08/2012 Inferolateral STEMI/Cath/PCI: LM nl, LAD minor irregs, RI 20p, LCX nl, OM1 100d (Tx w 2.75x27mm Promus DES), RCA nondom, minor irregs, EF60-65%.  . Hypertension   . Tobacco abuse   . Environmental allergies     ROS:   All systems reviewed and negative except as noted in the HPI.   Past Surgical History  Procedure Laterality Date  . Cholecystectomy    . Appendectomy       No family history on file.   History   Social History  . Marital  Status: Married    Spouse Name: N/A    Number of Children: N/A  . Years of Education: N/A   Occupational History  . Not on file.   Social History Main Topics  . Smoking status: Current Every Day Smoker -- 0.30 packs/day  . Smokeless tobacco: Not on file     Comment: Patient is still smoking everday but he is trying to quit.   . Alcohol Use: No  . Drug Use: No  . Sexual Activity: Not on file   Other Topics Concern  . Not on file   Social History Narrative  . No narrative on file     Blood pressure 219/120, pulse 56, respiration 18 Physical Exam:  Well appearing middle-aged man,NAD HEENT: Unremarkable Neck:  no JVD, no thyromegally Back:  No CVA tenderness Lungs:  Clear with no wheezes, rales, or rhonchi. HEART:  Regular rate rhythm, no murmurs, no rubs, no clicks, soft S4 gallop Abd:  soft, positive bowel sounds, no organomegally, no rebound, no guarding Ext:  2 plus pulses, no edema, no cyanosis, no clubbing Skin:  No rashes no nodules Neuro:  CN II through XII intact, motor grossly intact   Assess/Plan:

## 2013-11-22 NOTE — Telephone Encounter (Signed)
rx to pharm as requested

## 2013-11-22 NOTE — Patient Instructions (Signed)
Your physician recommends that you schedule a follow-up appointment in: 6 months with Dr Knox Saliva will receive a reminder letter two months in advance reminding you to call and schedule your appointment. If you don't receive this letter, please contact our office.  Your physician has recommended you make the following change in your medication:  1. Start Carvedilol 25 mg twice a day

## 2014-01-22 ENCOUNTER — Telehealth: Payer: Self-pay | Admitting: *Deleted

## 2014-01-22 NOTE — Telephone Encounter (Signed)
Referred patient to Denyse Amass prescription coordinator  at [facility].She will assist patient in getting reduced rate/free medication (effient)  Patient states he currently has a one week supple of medication

## 2014-01-22 NOTE — Telephone Encounter (Signed)
Pt needs effient samples

## 2014-01-29 ENCOUNTER — Telehealth: Payer: Self-pay | Admitting: *Deleted

## 2014-01-29 NOTE — Telephone Encounter (Signed)
Pt states that he still has not heard back about effient from [facility] and he would also like to know  How much longer he is going to be on this medication.  Pt would like sample if we have them till he hears from [employer]

## 2014-01-29 NOTE — Telephone Encounter (Signed)
Spoke with Sonja gunn,re-faxed patient info to assist her with patient  Will give pt additional 2 week supply

## 2014-04-18 ENCOUNTER — Telehealth: Payer: Self-pay

## 2014-04-18 ENCOUNTER — Telehealth: Payer: Self-pay | Admitting: *Deleted

## 2014-04-18 NOTE — Telephone Encounter (Signed)
Pt walked into clinic,reports he ran out of Effient samples,30 day free rx card printed from [email or web address] along with MD rx and pt was sent to his pharmacy as i do not have samples in office.   He will call back in the beginning of July to inquire about more samples and has apt with Dr.Taylor on 06/03/14 to see how much longer he will need to stay on medication

## 2014-04-18 NOTE — Telephone Encounter (Signed)
Duplicate documentation

## 2014-05-16 ENCOUNTER — Telehealth: Payer: Self-pay | Admitting: *Deleted

## 2014-05-16 ENCOUNTER — Telehealth: Payer: Self-pay

## 2014-05-16 NOTE — Telephone Encounter (Signed)
Pt is here to pick up samples of effient/tmj

## 2014-05-16 NOTE — Telephone Encounter (Signed)
Walk In ,given 3 wk supply effient 10 mg samples

## 2014-06-03 ENCOUNTER — Encounter: Payer: Self-pay | Admitting: Internal Medicine

## 2014-06-03 ENCOUNTER — Ambulatory Visit (INDEPENDENT_AMBULATORY_CARE_PROVIDER_SITE_OTHER): Payer: Self-pay | Admitting: Internal Medicine

## 2014-06-03 VITALS — BP 212/120 | HR 55 | Ht 67.0 in | Wt 149.4 lb

## 2014-06-03 DIAGNOSIS — I10 Essential (primary) hypertension: Secondary | ICD-10-CM

## 2014-06-03 DIAGNOSIS — F17200 Nicotine dependence, unspecified, uncomplicated: Secondary | ICD-10-CM

## 2014-06-03 DIAGNOSIS — Z720 Tobacco use: Secondary | ICD-10-CM

## 2014-06-03 DIAGNOSIS — I2510 Atherosclerotic heart disease of native coronary artery without angina pectoris: Secondary | ICD-10-CM

## 2014-06-03 MED ORDER — LOSARTAN POTASSIUM 50 MG PO TABS: 50.0000 mg | ORAL_TABLET | Freq: Every day | ORAL | Status: DC

## 2014-06-03 MED ORDER — HYDROCHLOROTHIAZIDE 25 MG PO TABS: 25.0000 mg | ORAL_TABLET | Freq: Every day | ORAL | Status: DC

## 2014-06-03 NOTE — Assessment & Plan Note (Signed)
Smoking cessation continues to be encouraged. He is trying to reduce his consumption.

## 2014-06-03 NOTE — Patient Instructions (Signed)
Your physician recommends that you schedule a follow-up appointment in: 3 months with Dr Lovena Le  Your physician has recommended you make the following change in your medication:   START Cozaar 50 mg daily  START HCTZ 25 mg daily  Please come for a blood pressure check in 2 weeks.  Your physician recommends that you return for lab work in 2 weeks. BMP

## 2014-06-03 NOTE — Assessment & Plan Note (Signed)
He has had no anginal symptoms. Will follow.

## 2014-06-03 NOTE — Assessment & Plan Note (Signed)
His blood pressure remains uncontrolled. He has not missed his meds. He is bradycardic. We cannot uptitrate his beta blocker. I have asked him to reduce his sodium intake and start losartan and a diuretic. Will plan to recheck his labs.

## 2014-06-03 NOTE — Progress Notes (Signed)
HPI [name] returns today for followup. He is a 64 yo man with sinus bradycarda, CAD, s/p MI approximately 2 years ago, with uncontrolled HTN and ongoing tobacco abuse. He has chronic pain in his gums. He states that he does not miss taking his medications but his blood pressure remains elevated. He is down to smoking less than a pack of cigarettes a day. He denies chest pain or sob. No HA. He has not had peripheral edema. Allergies  Allergen Reactions  . Sulfa Antibiotics Hives     Current Outpatient Prescriptions  Medication Sig Dispense Refill  . aspirin EC 81 MG EC tablet Take 1 tablet (81 mg total) by mouth daily.      Marland Kitchen atorvastatin (LIPITOR) 80 MG tablet Take 1 tablet (80 mg total) by mouth daily.  90 tablet  3  . carvedilol (COREG) 25 MG tablet Take 1 tablet (25 mg total) by mouth 2 (two) times daily with a meal.  90 tablet  6  . nitroGLYCERIN (NITROSTAT) 0.4 MG SL tablet Place 1 tablet (0.4 mg total) under the tongue every 5 (five) minutes x 3 doses as needed for chest pain.  25 tablet  3  . prasugrel (EFFIENT) 10 MG TABS Take 1 tablet (10 mg total) by mouth daily.  30 tablet  6  . Pregabalin (LYRICA PO) Take by mouth daily.      . hydrochlorothiazide (HYDRODIURIL) 25 MG tablet Take 1 tablet (25 mg total) by mouth daily.  30 tablet  3  . losartan (COZAAR) 50 MG tablet Take 1 tablet (50 mg total) by mouth daily.  30 tablet  3  . promethazine (PHENERGAN) 25 MG tablet Take 1 tablet (25 mg total) by mouth every 6 (six) hours as needed for nausea.  15 tablet  0   No current facility-administered medications for this visit.     Past Medical History  Diagnosis Date  . CAD (coronary artery disease)     a. 08/2012 Inferolateral STEMI/Cath/PCI: LM nl, LAD minor irregs, RI 20p, LCX nl, OM1 100d (Tx w 2.75x66mm Promus DES), RCA nondom, minor irregs, EF60-65%.  . Hypertension   . Tobacco abuse   . Environmental allergies     ROS:   All systems reviewed and negative except as  noted in the HPI.   Past Surgical History  Procedure Laterality Date  . Cholecystectomy    . Appendectomy       No family history on file.   History   Social History  . Marital Status: Married    Spouse Name: N/A    Number of Children: N/A  . Years of Education: N/A   Occupational History  . Not on file.   Social History Main Topics  . Smoking status: Current Every Day Smoker -- 0.30 packs/day  . Smokeless tobacco: Not on file     Comment: Patient is still smoking everday but he is trying to quit.   . Alcohol Use: No  . Drug Use: No  . Sexual Activity: Not on file   Other Topics Concern  . Not on file   Social History Narrative  . No narrative on file     BP 212/120  Pulse 55  Ht 5\' 7"  (1.702 m)  Wt 149 lb 6.4 oz (67.767 kg)  BMI 23.39 kg/m2  Physical Exam:  Well appearing middle aged man, NAD HEENT: Unremarkable Neck:  7 cm JVD, no thyromegally Back:  No CVA tenderness Lungs:  Clear with  no wheezes HEART:  Regular rate rhythm, no murmurs, no rubs, no clicks Abd:  soft, positive bowel sounds, no organomegally, no rebound, no guarding Ext:  2 plus pulses, no edema, no cyanosis, no clubbing Skin:  No rashes no nodules Neuro:  CN II through XII intact, motor grossly intact  EKG - nsr with LVH    Assess/Plan:

## 2014-06-05 ENCOUNTER — Telehealth: Payer: Self-pay | Admitting: Internal Medicine

## 2014-06-05 NOTE — Telephone Encounter (Signed)
Patient states that Danelle Berry was working on paperwork for him for his Effient.  Can you check on this when you have time? / tgs

## 2014-06-05 NOTE — Telephone Encounter (Signed)
Made pt aware that I faxed all the forms to [facility] on 06-03-14. Faxed them again today. Told pt that he should here from her this week.

## 2014-06-10 ENCOUNTER — Other Ambulatory Visit (HOSPITAL_COMMUNITY): Payer: Self-pay | Admitting: Nurse Practitioner

## 2014-06-17 ENCOUNTER — Telehealth: Payer: Self-pay

## 2014-06-17 NOTE — Telephone Encounter (Signed)
Pt dropped off bp log today,placed on Dr.Taylors desk for review as he is in office today

## 2014-06-19 ENCOUNTER — Telehealth: Payer: Self-pay | Admitting: *Deleted

## 2014-06-19 NOTE — Telephone Encounter (Signed)
Pt walked in stating that he spoke with Dr Lovena Le about Sildenafil. And that this medication would be cheaper for him. There is no note on this. Please advise if you would like me to fill this for him,

## 2014-06-25 NOTE — Telephone Encounter (Signed)
That would be fine with me. GT 

## 2014-06-26 ENCOUNTER — Other Ambulatory Visit: Payer: Self-pay | Admitting: *Deleted

## 2014-06-26 MED ORDER — SILDENAFIL CITRATE 50 MG PO TABS: 25.0000 mg | ORAL_TABLET | Freq: Every day | ORAL | Status: DC | PRN

## 2014-06-26 NOTE — Telephone Encounter (Signed)
Per Lovena Le filled

## 2014-07-02 ENCOUNTER — Telehealth: Payer: Self-pay

## 2014-07-02 NOTE — Telephone Encounter (Signed)
Pt gets meds free from health Dept.However, they cannot get Lipitor for free.They can get Crestor.Is this ok with you to switch and what does of Crestor do you prescribe ?

## 2014-07-09 NOTE — Telephone Encounter (Signed)
Dr Lovena Le agreed that patient could switch from Lipitor to Crestor 20 mg daily.I will need to get a signed rx when he is in [place] office again. Noralee Chars at health dept aware

## 2014-08-06 ENCOUNTER — Telehealth: Payer: Self-pay | Admitting: *Deleted

## 2014-08-06 NOTE — Telephone Encounter (Signed)
[name] at [employer] states she should know something by the 25 th of this month which is in 3 days.I asked pt to call back if he needed samples of Eliquis

## 2014-08-06 NOTE — Telephone Encounter (Signed)
Pt is calling to check on status of Effient assistance that Danelle Berry was working on for him

## 2014-08-14 ENCOUNTER — Telehealth: Payer: Self-pay | Admitting: *Deleted

## 2014-08-14 NOTE — Telephone Encounter (Signed)
Gave pt Gunn number to f/u according to telephone note pt was to find out from her on 9/25 and left samples of Effient for pickup.

## 2014-08-14 NOTE — Telephone Encounter (Signed)
Pt needs sample of effient and needs to know if he will be getting assistance, Denyse Amass was working on this a couple weeks ago but he has not heard back from [place] or her/tmj

## 2014-08-16 ENCOUNTER — Encounter: Payer: Self-pay | Admitting: Internal Medicine

## 2014-08-16 ENCOUNTER — Ambulatory Visit: Payer: Self-pay | Admitting: Internal Medicine

## 2014-09-11 ENCOUNTER — Telehealth: Payer: Self-pay | Admitting: *Deleted

## 2014-09-11 NOTE — Telephone Encounter (Signed)
Pt stopped by today for effient samples. States he did not here back from [employer], he left a message for her. Pt was given 1 bottle of effient

## 2014-09-12 NOTE — Telephone Encounter (Signed)
Pt is calling back for same reason as below, never got a call back yesterday.

## 2014-09-12 NOTE — Telephone Encounter (Signed)
Still waiting to hear back from Denyse Amass at health dept,have left another [facility]

## 2014-09-17 ENCOUNTER — Telehealth: Payer: Self-pay

## 2014-09-17 NOTE — Telephone Encounter (Signed)
Spoke with wife,told her to go see Denyse Amass at [employer] as she has had pt's Effient since Oct but could not reach him.

## 2014-10-24 ENCOUNTER — Encounter (HOSPITAL_COMMUNITY): Payer: Self-pay | Admitting: Cardiology

## 2014-11-18 ENCOUNTER — Ambulatory Visit (INDEPENDENT_AMBULATORY_CARE_PROVIDER_SITE_OTHER): Payer: Self-pay | Admitting: Internal Medicine

## 2014-11-18 ENCOUNTER — Encounter: Payer: Self-pay | Admitting: Internal Medicine

## 2014-11-18 VITALS — BP 154/104 | HR 62 | Ht 67.0 in | Wt 143.0 lb

## 2014-11-18 DIAGNOSIS — Z79899 Other long term (current) drug therapy: Secondary | ICD-10-CM

## 2014-11-18 DIAGNOSIS — E785 Hyperlipidemia, unspecified: Secondary | ICD-10-CM | POA: Insufficient documentation

## 2014-11-18 DIAGNOSIS — I10 Essential (primary) hypertension: Secondary | ICD-10-CM

## 2014-11-18 DIAGNOSIS — I2510 Atherosclerotic heart disease of native coronary artery without angina pectoris: Secondary | ICD-10-CM

## 2014-11-18 MED ORDER — CARVEDILOL 25 MG PO TABS: ORAL_TABLET | ORAL | Status: DC

## 2014-11-18 NOTE — Progress Notes (Signed)
HPI Mr. Denk returns today for followup. He is a 64 yo man with sinus bradycarda, CAD, s/p MI approximately 2 years ago, with uncontrolled HTN and ongoing tobacco abuse. He has chronic pain in his gums I think caused by Trigeminal neuralgia. He is down to smoking less than a pack of cigarettes a day. He denies chest pain or sob. No HA. He has not had peripheral edema. The patient inadvertently stopped taking his coreg when I added cozaar.  Allergies  Allergen Reactions  . Sulfa Antibiotics Hives     Current Outpatient Prescriptions  Medication Sig Dispense Refill  . aspirin EC 81 MG EC tablet Take 1 tablet (81 mg total) by mouth daily.    Marland Kitchen atorvastatin (LIPITOR) 80 MG tablet Take 1 tablet (80 mg total) by mouth daily. 90 tablet 3  . carvedilol (COREG) 25 MG tablet Take 1 tablet (25 mg total) by mouth 2 (two) times daily with a meal. 90 tablet 6  . hydrochlorothiazide (HYDRODIURIL) 25 MG tablet Take 1 tablet (25 mg total) by mouth daily. 30 tablet 3  . losartan (COZAAR) 50 MG tablet Take 1 tablet (50 mg total) by mouth daily. 30 tablet 3  . NITROSTAT 0.4 MG SL tablet PLACE ONE TABLET UNDER THE TONGUE EVERY FIVE MINUTES FOR THREE DOSES AS NEEDED FOR CHEST PAIN 25 tablet 0  . prasugrel (EFFIENT) 10 MG TABS Take 1 tablet (10 mg total) by mouth daily. 30 tablet 6  . Pregabalin (LYRICA PO) Take by mouth daily.    . sildenafil (VIAGRA) 50 MG tablet Take 50 mg by mouth daily as needed for erectile dysfunction.     No current facility-administered medications for this visit.     Past Medical History  Diagnosis Date  . CAD (coronary artery disease)     a. 08/2012 Inferolateral STEMI/Cath/PCI: LM nl, LAD minor irregs, RI 20p, LCX nl, OM1 100d (Tx w 2.75x25mm Promus DES), RCA nondom, minor irregs, EF60-65%.  . Hypertension   . Tobacco abuse   . Environmental allergies     ROS:   All systems reviewed and negative except as noted in the HPI.   Past Surgical History  Procedure  Laterality Date  . Cholecystectomy    . Appendectomy    . Left heart catheterization with coronary angiogram N/A 08/25/2012    Procedure: LEFT HEART CATHETERIZATION WITH CORONARY ANGIOGRAM;  Surgeon: Hillary Bow, MD;  Location: [facility] CATH LAB;  Service: Cardiovascular;  Laterality: N/A;  . Percutaneous coronary stent intervention (pci-s) Left 08/25/2012    Procedure: PERCUTANEOUS CORONARY STENT INTERVENTION (PCI-S);  Surgeon: Hillary Bow, MD;  Location: [facility] CATH LAB;  Service: Cardiovascular;  Laterality: Left;  stent x 1 om     No family history on file.   History   Social History  . Marital Status: Married    Spouse Name: N/A    Number of Children: N/A  . Years of Education: N/A   Occupational History  . Not on file.   Social History Main Topics  . Smoking status: Current Every Day Smoker -- 0.30 packs/day  . Smokeless tobacco: Not on file     Comment: Patient is still smoking everday but he is trying to quit.   . Alcohol Use: No  . Drug Use: No  . Sexual Activity: Not on file   Other Topics Concern  . Not on file   Social History Narrative     BP 154/104 mmHg  Pulse 62  Ht 5\' 7"  (1.702 m)  Wt 143 lb (64.864 kg)  BMI 22.39 kg/m2  Physical Exam:  Well appearing middle aged man, NAD HEENT: Unremarkable Neck:  7 cm JVD, no thyromegally Back:  No CVA tenderness Lungs:  Clear with no wheezes HEART:  Regular rate rhythm, no murmurs, no rubs, no clicks Abd:  soft, positive bowel sounds, no organomegally, no rebound, no guarding Ext:  2 plus pulses, no edema, no cyanosis, no clubbing Skin:  No rashes no nodules Neuro:  CN II through XII intact, motor grossly intact     Assess/Plan:

## 2014-11-18 NOTE — Assessment & Plan Note (Signed)
He is smoking minimally but I have asked him to stop.

## 2014-11-18 NOTE — Assessment & Plan Note (Signed)
His blood pressure is elevated but he is not taking his coreg. I asked him to restart 12.5 mg twice daily then uptitrate to 25 mg twice daily.

## 2014-11-18 NOTE — Assessment & Plan Note (Signed)
He denies anginal symptoms. He will restart his beta blocker. I have encouraged the patient to increase his physical activity.

## 2014-11-18 NOTE — Assessment & Plan Note (Signed)
He will continue his statin drug. He has not had labs drawn to check his liver function. Will also check his cpk and bmp. He is encouraged to reduce his fat intake.

## 2014-11-18 NOTE — Patient Instructions (Signed)
Your physician wants you to follow-up in: 1 year with Dr. Knox Saliva will receive a reminder letter in the mail two months in advance. If you don't receive a letter, please call our office to schedule the follow-up appointment.  Your physician has recommended you make the following change in your medication:   START COREG 25 MG 1/2 TABLET TWICE DAILY FOR 1 MONTH. THEN TAKE 1 TABLET TWICE DAILY   Your physician recommends that you return for lab work BMP/CK/LIVER  Thank you for choosing [facility]!!

## 2015-02-18 ENCOUNTER — Other Ambulatory Visit: Payer: Self-pay | Admitting: Internal Medicine

## 2015-04-30 ENCOUNTER — Telehealth: Payer: Self-pay

## 2015-04-30 NOTE — Telephone Encounter (Signed)
Mail a patient assistants  to [place] [address] [id number]

## 2015-05-21 ENCOUNTER — Telehealth: Payer: Self-pay

## 2015-05-21 NOTE — Telephone Encounter (Signed)
Pt picked up free crestor 20 mg #90

## 2015-12-04 ENCOUNTER — Encounter: Payer: Self-pay | Admitting: Internal Medicine

## 2015-12-04 ENCOUNTER — Ambulatory Visit (INDEPENDENT_AMBULATORY_CARE_PROVIDER_SITE_OTHER): Payer: Commercial Managed Care - HMO | Admitting: Internal Medicine

## 2015-12-04 VITALS — BP 166/120 | HR 67 | Ht 67.0 in | Wt 153.0 lb

## 2015-12-04 DIAGNOSIS — I25810 Atherosclerosis of coronary artery bypass graft(s) without angina pectoris: Secondary | ICD-10-CM

## 2015-12-04 DIAGNOSIS — E785 Hyperlipidemia, unspecified: Secondary | ICD-10-CM

## 2015-12-04 DIAGNOSIS — Z720 Tobacco use: Secondary | ICD-10-CM | POA: Diagnosis not present

## 2015-12-04 DIAGNOSIS — I119 Hypertensive heart disease without heart failure: Secondary | ICD-10-CM

## 2015-12-04 NOTE — Progress Notes (Signed)
HPI [name] returns today for followup. He is a 65 yo man with sinus bradycarda, CAD, s/p MI approximately 2 years ago, with uncontrolled HTN and ongoing tobacco abuse. He has chronic pain in his gums I think caused by Trigeminal neuralgia. He is down to smoking less than a pack of cigarettes a day. He denies chest pain or sob. No HA. He has not had peripheral edema. HIs blood pressure remains high at least in part due to his pain on the right side of his pain from the neuralgia. Allergies  Allergen Reactions  . Sulfa Antibiotics Hives     Current Outpatient Prescriptions  Medication Sig Dispense Refill  . aspirin EC 81 MG EC tablet Take 1 tablet (81 mg total) by mouth daily.    . carvedilol (COREG) 25 MG tablet Take 1/2 tablet twice daily for 1 month, then take 1 tablet twice daily 180 tablet 3  . hydrochlorothiazide (HYDRODIURIL) 25 MG tablet Take 1 tablet (25 mg total) by mouth daily. 30 tablet 3  . losartan (COZAAR) 50 MG tablet TAKE ONE (1) TABLET BY MOUTH EVERY DAY 30 tablet 9  . NITROSTAT 0.4 MG SL tablet PLACE ONE TABLET UNDER THE TONGUE EVERY FIVE MINUTES FOR THREE DOSES AS NEEDED FOR CHEST PAIN 25 tablet 0  . prasugrel (EFFIENT) 10 MG TABS Take 1 tablet (10 mg total) by mouth daily. 30 tablet 6  . Pregabalin (LYRICA PO) Take by mouth daily.    . rosuvastatin (CRESTOR) 20 MG tablet Take 20 mg by mouth daily.    . sildenafil (VIAGRA) 50 MG tablet Take 50 mg by mouth daily as needed for erectile dysfunction.     No current facility-administered medications for this visit.     Past Medical History  Diagnosis Date  . CAD (coronary artery disease)     a. 08/2012 Inferolateral STEMI/Cath/PCI: LM nl, LAD minor irregs, RI 20p, LCX nl, OM1 100d (Tx w 2.75x80mm Promus DES), RCA nondom, minor irregs, EF60-65%.  . Hypertension   . Tobacco abuse   . Environmental allergies     ROS:   All systems reviewed and negative except as noted in the HPI.   Past Surgical History    Procedure Laterality Date  . Cholecystectomy    . Appendectomy    . Left heart catheterization with coronary angiogram N/A 08/25/2012    Procedure: LEFT HEART CATHETERIZATION WITH CORONARY ANGIOGRAM;  Surgeon: Hillary Bow, MD;  Location: [facility] CATH LAB;  Service: Cardiovascular;  Laterality: N/A;  . Percutaneous coronary stent intervention (pci-s) Left 08/25/2012    Procedure: PERCUTANEOUS CORONARY STENT INTERVENTION (PCI-S);  Surgeon: Hillary Bow, MD;  Location: [facility] CATH LAB;  Service: Cardiovascular;  Laterality: Left;  stent x 1 om     No family history on file.   Social History   Social History  . Marital Status: Married    Spouse Name: N/A  . Number of Children: N/A  . Years of Education: N/A   Occupational History  . Not on file.   Social History Main Topics  . Smoking status: Current Every Day Smoker -- 0.30 packs/day  . Smokeless tobacco: Not on file     Comment: Patient is still smoking everday but he is trying to quit.   . Alcohol Use: No  . Drug Use: No  . Sexual Activity: Not on file   Other Topics Concern  . Not on file   Social History Narrative     BP  166/120 mmHg  Pulse 67  Ht 5\' 7"  (1.702 m)  Wt 153 lb (69.4 kg)  BMI 23.96 kg/m2  SpO2 96%  Physical Exam:  Well appearing middle aged man, NAD HEENT: Unremarkable Neck:  7 cm JVD, no thyromegally Back:  No CVA tenderness Lungs:  Clear with no wheezes HEART:  Regular rate rhythm, no murmurs, no rubs, no clicks Abd:  soft, positive bowel sounds, no organomegally, no rebound, no guarding Ext:  2 plus pulses, no edema, no cyanosis, no clubbing Skin:  No rashes no nodules Neuro:  CN II through XII intact, motor grossly intact   ECG - NSR with Lateral STT changes.  Assess/Plan:

## 2015-12-04 NOTE — Assessment & Plan Note (Signed)
I have asked the patient to stop smoking. He will try.

## 2015-12-04 NOTE — Assessment & Plan Note (Signed)
His blood pressure remains elevated. He is bothered by ongoing neuralgia on the right side of his face. He denies medical non-compliance. He notes that at home his pressure is much better. For all of the above, I am not inclined to add additional coreg but would if his pressures continue to be high.

## 2015-12-04 NOTE — Patient Instructions (Signed)
Your physician wants you to follow-up in: 1 Year with Dr. Taylor. You will receive a reminder letter in the mail two months in advance. If you don't receive a letter, please call our office to schedule the follow-up appointment.  Your physician recommends that you continue on your current medications as directed. Please refer to the Current Medication list given to you today.  If you need a refill on your cardiac medications before your next appointment, please call your pharmacy.  Thank you for choosing [facility]!    

## 2015-12-04 NOTE — Assessment & Plan Note (Signed)
He will continue his crestor. I have asked him to reduce his fat intake.

## 2015-12-04 NOTE — Assessment & Plan Note (Signed)
He denies anginal symptoms. He will continue his current meds.  

## 2015-12-22 ENCOUNTER — Other Ambulatory Visit: Payer: Self-pay | Admitting: Internal Medicine

## 2016-01-23 ENCOUNTER — Other Ambulatory Visit: Payer: Self-pay | Admitting: Internal Medicine

## 2016-01-24 ENCOUNTER — Encounter (HOSPITAL_COMMUNITY): Payer: Self-pay | Admitting: Emergency Medicine

## 2016-01-24 ENCOUNTER — Emergency Department (HOSPITAL_COMMUNITY)
Admission: EM | Admit: 2016-01-24 | Discharge: 2016-01-24 | Disposition: A | Discharge status: Home / Self Care | Payer: Commercial Managed Care - HMO | Attending: Emergency Medicine | Admitting: Emergency Medicine

## 2016-01-24 ENCOUNTER — Emergency Department (HOSPITAL_COMMUNITY): Payer: Commercial Managed Care - HMO

## 2016-01-24 DIAGNOSIS — R0789 Other chest pain: Secondary | ICD-10-CM | POA: Diagnosis not present

## 2016-01-24 DIAGNOSIS — Z79899 Other long term (current) drug therapy: Secondary | ICD-10-CM | POA: Insufficient documentation

## 2016-01-24 DIAGNOSIS — I160 Hypertensive urgency: Secondary | ICD-10-CM | POA: Diagnosis not present

## 2016-01-24 DIAGNOSIS — F17210 Nicotine dependence, cigarettes, uncomplicated: Secondary | ICD-10-CM | POA: Diagnosis not present

## 2016-01-24 DIAGNOSIS — I252 Old myocardial infarction: Secondary | ICD-10-CM | POA: Insufficient documentation

## 2016-01-24 DIAGNOSIS — Z7982 Long term (current) use of aspirin: Secondary | ICD-10-CM | POA: Diagnosis not present

## 2016-01-24 DIAGNOSIS — N23 Unspecified renal colic: Secondary | ICD-10-CM | POA: Insufficient documentation

## 2016-01-24 HISTORY — DX: Acute myocardial infarction, unspecified: I21.9

## 2016-01-24 LAB — URINALYSIS, ROUTINE W REFLEX MICROSCOPIC
Bilirubin Urine: NEGATIVE
GLUCOSE, UA: NEGATIVE mg/dL
LEUKOCYTES UA: NEGATIVE
Nitrite: NEGATIVE
PH: 5.5 (ref 5.0–8.0)
Protein, ur: 30 mg/dL — AB
Specific Gravity, Urine: 1.02 (ref 1.005–1.030)

## 2016-01-24 LAB — URINE MICROSCOPIC-ADD ON: Squamous Epithelial / LPF: NONE SEEN

## 2016-01-24 LAB — BASIC METABOLIC PANEL
Anion gap: 7 (ref 5–15)
BUN: 18 mg/dL (ref 6–20)
CHLORIDE: 110 mmol/L (ref 101–111)
CO2: 23 mmol/L (ref 22–32)
CREATININE: 1.49 mg/dL — AB (ref 0.61–1.24)
Calcium: 9 mg/dL (ref 8.9–10.3)
GFR calc non Af Amer: 48 mL/min — ABNORMAL LOW (ref >60)
GFR, EST AFRICAN AMERICAN: 55 mL/min — AB (ref >60)
GLUCOSE: 122 mg/dL — AB (ref 65–99)
Potassium: 3.8 mmol/L (ref 3.5–5.1)
Sodium: 140 mmol/L (ref 135–145)

## 2016-01-24 LAB — CBC
HCT: 46.7 % (ref 39.0–52.0)
Hemoglobin: 15.8 g/dL (ref 13.0–17.0)
MCH: 31.3 pg (ref 26.0–34.0)
MCHC: 33.8 g/dL (ref 30.0–36.0)
MCV: 92.7 fL (ref 78.0–100.0)
PLATELETS: 168 10*3/uL (ref 150–400)
RBC: 5.04 MIL/uL (ref 4.22–5.81)
RDW: 13 % (ref 11.5–15.5)
WBC: 8 10*3/uL (ref 4.0–10.5)

## 2016-01-24 LAB — TROPONIN I: Troponin I: <0.03 ng/mL (ref <0.031)

## 2016-01-24 MED ORDER — MORPHINE SULFATE (PF) 4 MG/ML IV SOLN
4.0000 mg | Freq: Once | INTRAVENOUS | Status: AC
  Administered 2016-01-24: 4 mg via INTRAVENOUS
  Filled 2016-01-24: qty 1

## 2016-01-24 MED ORDER — ONDANSETRON HCL 4 MG/2ML IJ SOLN
INTRAMUSCULAR | Status: AC
  Administered 2016-01-24: 4 mg via INTRAVENOUS
  Filled 2016-01-24: qty 2

## 2016-01-24 MED ORDER — ONDANSETRON HCL 4 MG/2ML IJ SOLN: 4.0000 mg | Freq: Once | INTRAMUSCULAR | Status: DC

## 2016-01-24 MED ORDER — SODIUM CHLORIDE 0.9 % IV SOLN
INTRAVENOUS | Status: DC
  Administered 2016-01-24: 15:00:00 via INTRAVENOUS

## 2016-01-24 MED ORDER — HYDRALAZINE HCL 20 MG/ML IJ SOLN
20.0000 mg | INTRAMUSCULAR | Status: DC | PRN
  Administered 2016-01-24 (×2): 20 mg via INTRAVENOUS
  Filled 2016-01-24 (×3): qty 1

## 2016-01-24 MED ORDER — HYDRALAZINE HCL 10 MG PO TABS: 10.0000 mg | ORAL_TABLET | Freq: Four times a day (QID) | ORAL | Status: DC

## 2016-01-24 MED ORDER — HYDROCODONE-ACETAMINOPHEN 5-325 MG PO TABS: 2.0000 | ORAL_TABLET | ORAL | Status: DC | PRN

## 2016-01-24 MED ORDER — HYDRALAZINE HCL 10 MG PO TABS
10.0000 mg | ORAL_TABLET | Freq: Once | ORAL | Status: AC
  Administered 2016-01-24: 10 mg via ORAL
  Filled 2016-01-24 (×2): qty 1

## 2016-01-24 MED ORDER — ONDANSETRON HCL 4 MG/2ML IJ SOLN
4.0000 mg | Freq: Once | INTRAMUSCULAR | Status: AC
  Administered 2016-01-24 (×2): 4 mg via INTRAVENOUS
  Filled 2016-01-24: qty 2

## 2016-01-24 NOTE — ED Notes (Signed)
Pt states understanding of care given and follow up instructions.  Ambulated from ED with family  

## 2016-01-24 NOTE — ED Notes (Signed)
Patient complaining of chest pain starting approximately 1200 today, is now radiating into right sided back pain. States chest pain has resolved and only hurts in his back at this time. Also complaining of shortness of breath and diaphoresis prior to arrival to ED.

## 2016-01-24 NOTE — ED Notes (Signed)
Pt stated he was having back pain about a week ago but it went away. Complain of pain in right lower back now. States he had some chest discomfort but it went away

## 2016-01-24 NOTE — Discharge Instructions (Signed)
Hypertension Hypertension, commonly called high blood pressure, is when the force of blood pumping through your arteries is too strong. Your arteries are the blood vessels that carry blood from your heart throughout your body. A blood pressure reading consists of a higher number over a lower number, such as 110/72. The higher number (systolic) is the pressure inside your arteries when your heart pumps. The lower number (diastolic) is the pressure inside your arteries when your heart relaxes. Ideally you want your blood pressure below 120/80. Hypertension forces your heart to work harder to pump blood. Your arteries may become narrow or stiff. Having untreated or uncontrolled hypertension can cause heart attack, stroke, kidney disease, and other problems. RISK FACTORS Some risk factors for high blood pressure are controllable. Others are not.  Risk factors you cannot control include:   Race. You may be at higher risk if you are African American.  Age. Risk increases with age.  Gender. Men are at higher risk than women before age 31 years. After age 27, women are at higher risk than men. Risk factors you can control include:  Not getting enough exercise or physical activity.  Being overweight.  Getting too much fat, sugar, calories, or salt in your diet.  Drinking too much alcohol. SIGNS AND SYMPTOMS Hypertension does not usually cause signs or symptoms. Extremely high blood pressure (hypertensive crisis) may cause headache, anxiety, shortness of breath, and nosebleed. DIAGNOSIS To check if you have hypertension, your health care provider will measure your blood pressure while you are seated, with your arm held at the level of your heart. It should be measured at least twice using the same arm. Certain conditions can cause a difference in blood pressure between your right and left arms. A blood pressure reading that is higher than normal on one occasion does not mean that you need  treatment. If it is not clear whether you have high blood pressure, you may be asked to return on a different day to have your blood pressure checked again. Or, you may be asked to monitor your blood pressure at home for 1 or more weeks. TREATMENT Treating high blood pressure includes making lifestyle changes and possibly taking medicine. Living a healthy lifestyle can help lower high blood pressure. You may need to change some of your habits. Lifestyle changes may include:  Following the DASH diet. This diet is high in fruits, vegetables, and whole grains. It is low in salt, red meat, and added sugars.  Keep your sodium intake below 2,300 mg per day.  Getting at least 30-45 minutes of aerobic exercise at least 4 times per week.  Losing weight if necessary.  Not smoking.  Limiting alcoholic beverages.  Learning ways to reduce stress. Your health care provider may prescribe medicine if lifestyle changes are not enough to get your blood pressure under control, and if one of the following is true:  You are 37-68 years of age and your systolic blood pressure is above 140.  You are 32 years of age or older, and your systolic blood pressure is above 150.  Your diastolic blood pressure is above 90.  You have diabetes, and your systolic blood pressure is over [id number] or your diastolic blood pressure is over 90.  You have kidney disease and your blood pressure is above 140/90.  You have heart disease and your blood pressure is above 140/90. Your personal target blood pressure may vary depending on your medical conditions, your age, and other factors. HOME  CARE INSTRUCTIONS  Have your blood pressure rechecked as directed by your health care provider.   Take medicines only as directed by your health care provider. Follow the directions carefully. Blood pressure medicines must be taken as prescribed. The medicine does not work as well when you skip doses. Skipping doses also puts you at risk for  problems.  Do not smoke.   Monitor your blood pressure at home as directed by your health care provider. SEEK MEDICAL CARE IF:   You think you are having a reaction to medicines taken.  You have recurrent headaches or feel dizzy.  You have swelling in your ankles.  You have trouble with your vision. SEEK IMMEDIATE MEDICAL CARE IF:  You develop a severe headache or confusion.  You have unusual weakness, numbness, or feel faint.  You have severe chest or abdominal pain.  You vomit repeatedly.  You have trouble breathing. MAKE SURE YOU:   Understand these instructions.  Will watch your condition.  Will get help right away if you are not doing well or get worse.   This information is not intended to replace advice given to you by your health care provider. Make sure you discuss any questions you have with your health care provider.   Document Released: 11/01/2005 Document Revised: 03/18/2015 Document Reviewed: 08/24/2013 Elsevier Interactive Patient Education 2016 [employer]    Kidney Stones Kidney stones (urolithiasis) are deposits that form inside your kidneys. The intense pain is caused by the stone moving through the urinary tract. When the stone moves, the ureter goes into spasm around the stone. The stone is usually passed in the urine.  CAUSES   A disorder that makes certain neck glands produce too much parathyroid hormone (primary hyperparathyroidism).  A buildup of uric acid crystals, similar to gout in your joints.  Narrowing (stricture) of the ureter.  A kidney obstruction present at birth (congenital obstruction).  Previous surgery on the kidney or ureters.  Numerous kidney infections. SYMPTOMS   Feeling sick to your stomach (nauseous).  Throwing up (vomiting).  Blood in the urine (hematuria).  Pain that usually spreads (radiates) to the groin.  Frequency or urgency of urination. DIAGNOSIS   Taking a history and physical exam.  Blood  or urine tests.  CT scan.  Occasionally, an examination of the inside of the urinary bladder (cystoscopy) is performed. TREATMENT   Observation.  Increasing your fluid intake.  Extracorporeal shock wave lithotripsy--This is a noninvasive procedure that uses shock waves to break up kidney stones.  Surgery may be needed if you have severe pain or persistent obstruction. There are various surgical procedures. Most of the procedures are performed with the use of small instruments. Only small incisions are needed to accommodate these instruments, so recovery time is minimized. The size, location, and chemical composition are all important variables that will determine the proper choice of action for you. Talk to your health care provider to better understand your situation so that you will minimize the risk of injury to yourself and your kidney.  HOME CARE INSTRUCTIONS   Drink enough water and fluids to keep your urine clear or pale yellow. This will help you to pass the stone or stone fragments.  Strain all urine through the provided strainer. Keep all particulate matter and stones for your health care provider to see. The stone causing the pain may be as small as a grain of salt. It is very important to use the strainer each and every time you pass  your urine. The collection of your stone will allow your health care provider to analyze it and verify that a stone has actually passed. The stone analysis will often identify what you can do to reduce the incidence of recurrences.  Only take over-the-counter or prescription medicines for pain, discomfort, or fever as directed by your health care provider.  Keep all follow-up visits as told by your health care provider. This is important.  Get follow-up X-rays if required. The absence of pain does not always mean that the stone has passed. It may have only stopped moving. If the urine remains completely obstructed, it can cause loss of kidney  function or even complete destruction of the kidney. It is your responsibility to make sure X-rays and follow-ups are completed. Ultrasounds of the kidney can show blockages and the status of the kidney. Ultrasounds are not associated with any radiation and can be performed easily in a matter of minutes.  Make changes to your daily diet as told by your health care provider. You may be told to:  Limit the amount of salt that you eat.  Eat 5 or more servings of fruits and vegetables each day.  Limit the amount of meat, poultry, fish, and eggs that you eat.  Collect a 24-hour urine sample as told by your health care provider.You may need to collect another urine sample every 6-12 months. SEEK MEDICAL CARE IF:  You experience pain that is progressive and unresponsive to any pain medicine you have been prescribed. SEEK IMMEDIATE MEDICAL CARE IF:   Pain cannot be controlled with the prescribed medicine.  You have a fever or shaking chills.  The severity or intensity of pain increases over 18 hours and is not relieved by pain medicine.  You develop a new onset of abdominal pain.  You feel faint or pass out.  You are unable to urinate.   This information is not intended to replace advice given to you by your health care provider. Make sure you discuss any questions you have with your health care provider.   Document Released: 11/01/2005 Document Revised: 07/23/2015 Document Reviewed: 04/04/2013 Elsevier Interactive Patient Education [employer].

## 2016-01-24 NOTE — ED Notes (Addendum)
Pt given gingerale for fluid challenge. 

## 2016-01-24 NOTE — ED Notes (Signed)
Patient reports nausea is better.

## 2016-01-24 NOTE — ED Provider Notes (Signed)
CSN: [id number]     Arrival date & time 01/24/16  1423 History   First MD Initiated Contact with Patient 01/24/16 1441     Chief Complaint  Patient presents with  . Chest Pain     (Consider location/radiation/quality/duration/timing/severity/associated sxs/prior Treatment) HPI   [name] is a 65 y.o. male who presents for evaluation of right lower back pain which started 1 week ago, and has been intermittent. The pain worsened today and he decided to come and get it checked out. He also had right-sided chest pain for 5 minutes today, which came and resolved spontaneously. He denies trauma to, or prior problems with the back. He denies fever, chills, cough, shortness of breath, weakness or dizziness. He takes his usual medications in the evening, and has been doing that regularly. There are no other known modifying factors.   Past Medical History  Diagnosis Date  . CAD (coronary artery disease)     a. 08/2012 Inferolateral STEMI/Cath/PCI: LM nl, LAD minor irregs, RI 20p, LCX nl, OM1 100d (Tx w 2.75x9mm Promus DES), RCA nondom, minor irregs, EF60-65%.  . Hypertension   . Tobacco abuse   . Environmental allergies   . Acute MI [facility])    Past Surgical History  Procedure Laterality Date  . Cholecystectomy    . Appendectomy    . Left heart catheterization with coronary angiogram N/A 08/25/2012    Procedure: LEFT HEART CATHETERIZATION WITH CORONARY ANGIOGRAM;  Surgeon: Hillary Bow, MD;  Location: [facility] CATH LAB;  Service: Cardiovascular;  Laterality: N/A;  . Percutaneous coronary stent intervention (pci-s) Left 08/25/2012    Procedure: PERCUTANEOUS CORONARY STENT INTERVENTION (PCI-S);  Surgeon: Hillary Bow, MD;  Location: [facility] CATH LAB;  Service: Cardiovascular;  Laterality: Left;  stent x 1 om   History reviewed. No pertinent family history. Social History  Substance Use Topics  . Smoking status: Current Every Day Smoker -- 0.30 packs/day  . Smokeless tobacco: None      Comment: Patient is still smoking everday but he is trying to quit.   . Alcohol Use: No    Review of Systems  All other systems reviewed and are negative.     Allergies  Sulfa antibiotics  Home Medications   Prior to Admission medications   Medication Sig Start Date End Date Taking? Authorizing Provider  albuterol (PROVENTIL HFA;VENTOLIN HFA) 108 (90 Base) MCG/ACT inhaler Inhale 2 puffs into the lungs every 6 (six) hours as needed for wheezing or shortness of breath.   Yes Historical Provider, MD  aspirin EC 81 MG EC tablet Take 1 tablet (81 mg total) by mouth daily. Patient taking differently: Take 81 mg by mouth at bedtime.  08/28/12  Yes Rogelia Mire, NP  carvedilol (COREG) 25 MG tablet TAKE ONE-HALF TABLET TWICE DAILY FOR ONEMONTH, THEN TAKE ONE TABLET TWICE DAILY THEREAFTER Patient taking differently: TAKE ONE TABLET TWICE DAILY 12/23/15  Yes Evans Lance, MD  losartan (COZAAR) 50 MG tablet TAKE ONE (1) TABLET BY MOUTH EVERY DAY Patient taking differently: TAKE ONE (1) TABLET BY MOUTH AT BEDTIME 02/19/15  Yes Evans Lance, MD  prasugrel (EFFIENT) 10 MG TABS Take 1 tablet (10 mg total) by mouth daily. Patient taking differently: Take 10 mg by mouth at bedtime.  08/28/12  Yes Rogelia Mire, NP  hydrALAZINE (APRESOLINE) 10 MG tablet Take 1 tablet (10 mg total) by mouth 4 (four) times daily. 01/24/16   Daleen Bo, MD  hydrochlorothiazide (HYDRODIURIL) 25 MG tablet Take 1  tablet (25 mg total) by mouth daily. 06/03/14   Evans Lance, MD  HYDROcodone-acetaminophen (NORCO/VICODIN) 5-325 MG tablet Take 2 tablets by mouth every 4 (four) hours as needed. 01/24/16   Daleen Bo, MD  NITROSTAT 0.4 MG SL tablet PLACE ONE TABLET UNDER THE TONGUE EVERY FIVE MINUTES FOR THREE DOSES AS NEEDED FOR CHEST PAIN    Evans Lance, MD   BP 161/101 mmHg  Pulse 88  Temp(Src) 97.4 F (36.3 C) (Oral)  Resp 15  Ht 5\' 6"  (1.676 m)  Wt 148 lb (67.132 kg)  BMI 23.90 kg/m2  SpO2  95% Physical Exam  Constitutional: He is oriented to person, place, and time. He appears well-developed and well-nourished. Distressed: he is uncomfortable.  HENT:  Head: Normocephalic and atraumatic.  Right Ear: External ear normal.  Left Ear: External ear normal.  Eyes: Conjunctivae and EOM are normal. Pupils are equal, round, and reactive to light.  Neck: Normal range of motion and phonation normal. Neck supple.  Cardiovascular: Normal rate, regular rhythm and normal heart sounds.   Pulmonary/Chest: Effort normal and breath sounds normal. No respiratory distress. He has no wheezes. He exhibits no tenderness and no bony tenderness.  Abdominal: Soft. There is no tenderness.  Musculoskeletal: Normal range of motion.  Mild right lower back tenderness. No deformity of the thoracic or lumbar spine.  Neurological: He is alert and oriented to person, place, and time. No cranial nerve deficit or sensory deficit. He exhibits normal muscle tone. Coordination normal.  Skin: Skin is warm, dry and intact.  Psychiatric: He has a normal mood and affect. His behavior is normal. Judgment and thought content normal.  Nursing note and vitals reviewed.   ED Course  Procedures (including critical care time) Medications  0.9 %  sodium chloride infusion ( Intravenous Stopped 01/24/16 1900)  hydrALAZINE (APRESOLINE) injection 20 mg (20 mg Intravenous Given 01/24/16 1716)  ondansetron (ZOFRAN) injection 4 mg (0 mg Intravenous Hold 01/24/16 1745)  hydrALAZINE (APRESOLINE) tablet 10 mg (not administered)  morphine 4 MG/ML injection 4 mg (4 mg Intravenous Given 01/24/16 1445)  ondansetron (ZOFRAN) injection 4 mg (4 mg Intravenous Given 01/24/16 1743)  morphine 4 MG/ML injection 4 mg (4 mg Intravenous Given 01/24/16 1534)    Patient Vitals for the past 24 hrs:  BP Temp Temp src Pulse Resp SpO2 Height Weight  01/24/16 2107 (!) 161/101 mmHg - - 88 15 95 % - -  01/24/16 2000 (!) 186/114 mmHg - - 87 16 99 % - -   01/24/16 1945 (!) 158/108 mmHg - - 88 17 99 % - -  01/24/16 1930 (!) 151/104 mmHg - - 84 13 98 % - -  01/24/16 1915 158/98 mmHg - - 89 14 100 % - -  01/24/16 1900 (!) 146/111 mmHg - - 86 14 98 % - -  01/24/16 1845 (!) 147/111 mmHg - - 86 12 97 % - -  01/24/16 1830 151/98 mmHg - - 81 12 99 % - -  01/24/16 1815 145/99 mmHg - - 85 18 100 % - -  01/24/16 1800 124/84 mmHg - - 84 18 96 % - -  01/24/16 1745 132/91 mmHg - - 74 16 99 % - -  01/24/16 1730 141/98 mmHg - - 89 18 98 % - -  01/24/16 1713 (!) 183/118 mmHg - - 85 17 98 % - -  01/24/16 1700 (!) 207/129 mmHg - - 75 - 99 % - -  01/24/16 1630 (!) 222/123 mmHg - - Marland Kitchen)  53 19 97 % - -  01/24/16 1615 - - - (!) 54 22 96 % - -  01/24/16 1600 (!) 231/137 mmHg - - (!) 51 17 96 % - -  01/24/16 1545 - - - (!) 51 15 96 % - -  01/24/16 1530 (!) 235/135 mmHg - - (!) 47 16 98 % - -  01/24/16 1500 (!) 235/126 mmHg - - (!) 49 21 100 % - -  01/24/16 1500 (!) 230/122 mmHg - - (!) 44 19 92 % - -  01/24/16 1428 (!) 251/142 mmHg 97.4 F (36.3 C) Oral (!) 48 15 100 % 5\' 6"  (1.676 m) 148 lb (67.132 kg)   The patient vomited about 2 minutes after he was treated with morphine and Zofran IV.    4:55 PM Reevaluation with update and discussion. After initial assessment and treatment, an updated evaluation reveals he states that his lower back pain is improved. Blood pressure is improved but still markedly elevated. We'll initiate treatment now for hypertensive urgency. Zenab Gronewold L   17:45- BP improved, MAP corrected > 25%. C/O mild nausea. Zofran ordered.  9:04 PM Reevaluation with update and discussion. After initial assessment and treatment, an updated evaluation reveals he is comfortable now with blood pressure gradually increasing, 161/101. Findings discussed with patient and his family member, all questions answered. He denies back pain at this time.Daleen Bo L   CRITICAL CARE Performed by: Richarda Blade Total critical care time: 50  minutes Critical care time was exclusive of separately billable procedures and treating other patients. Critical care was necessary to treat or prevent imminent or life-threatening deterioration. Critical care was time spent personally by me on the following activities: development of treatment plan with patient and/or surrogate as well as nursing, discussions with consultants, evaluation of patient's response to treatment, examination of patient, obtaining history from patient or surrogate, ordering and performing treatments and interventions, ordering and review of laboratory studies, ordering and review of radiographic studies, pulse oximetry and re-evaluation of patient's condition.  Labs Review Labs Reviewed  BASIC METABOLIC PANEL - Abnormal; Notable for the following:    Glucose, Bld 122 (*)    Creatinine, Ser 1.49 (*)    GFR calc non Af Amer 48 (*)    GFR calc Af Amer 55 (*)    All other components within normal limits  URINALYSIS, ROUTINE W REFLEX MICROSCOPIC (NOT AT [facility]) - Abnormal; Notable for the following:    Hgb urine dipstick MODERATE (*)    Ketones, ur TRACE (*)    Protein, ur 30 (*)    All other components within normal limits  URINE MICROSCOPIC-ADD ON - Abnormal; Notable for the following:    Bacteria, UA RARE (*)    All other components within normal limits  CBC  TROPONIN I    Imaging Review Dg Chest 2 View  01/24/2016  CLINICAL DATA:  Back pain EXAM: CHEST  2 VIEW COMPARISON:  None. FINDINGS: The heart, hila, and mediastinum are normal. No pneumothorax. Small symmetric nodules projected over the lung bases are likely nipple shadows. No other nodules. No masses or focal infiltrates. IMPRESSION: Probable nipple shadows. A repeat film with nipple markers could confirm. No acute abnormality. Electronically Signed   By: Dorise Bullion III M.D   On: 01/24/2016 15:47   Ct Renal Stone Study  01/24/2016  CLINICAL DATA:  Right-sided pain since noon today with nausea. EXAM: CT  ABDOMEN AND PELVIS WITHOUT CONTRAST TECHNIQUE: Multidetector CT imaging of the abdomen and  pelvis was performed following the standard protocol without IV contrast. COMPARISON:  06/07/2012. FINDINGS: Lower chest:  Unremarkable. Hepatobiliary: No focal abnormality in the liver on this study without intravenous contrast. No evidence of hepatomegaly. Gallbladder is surgically absent. No intrahepatic or extrahepatic biliary dilation. Pancreas: No focal mass lesion. No dilatation of the main duct. No intraparenchymal cyst. No peripancreatic edema. Spleen: No splenomegaly. No focal mass lesion. Adrenals/Urinary Tract: 1.9 cm right adrenal adenomas stable. 1.8 cm left adrenal nodule is stable and compatible with adenoma. There is subtle right perinephric edema without evidence for right renal stone or right hydronephrosis. There may be some subtle right perienteric edema without hydroureter. 11 mm water density lesion in the lower pole the right kidney is compatible with a cyst 3.0 cm water density lesion in the lower pole of the left kidney was 2.4 cm previously and remains consistent with a cyst. 6 x 7 mm nonobstructing stone is seen in the lower pole of the left kidney. The left ureter is normal. 3 mm stone is identified in the posterior bladder lumen, not associated with either UVJ. Stomach/Bowel: Stomach is nondistended. No gastric wall thickening. No evidence of outlet obstruction. Duodenum is normally positioned as is the ligament of Treitz. No small bowel wall thickening. No small bowel dilatation. The terminal ileum is normal. Nonvisualization of the appendix is consistent with the reported history of appendectomy. Diverticular changes are noted in the left colon without evidence of diverticulitis. Vascular/Lymphatic: There is abdominal aortic atherosclerosis without aneurysm. There is no gastrohepatic or hepatoduodenal ligament lymphadenopathy. No intraperitoneal or retroperitoneal lymphadenopathy. No pelvic  sidewall lymphadenopathy. Reproductive: The prostate gland and seminal vesicles have normal imaging features. Other: No intraperitoneal free fluid. Musculoskeletal: 14 mm sclerotic lesion in the right posterior iliac bone is stable and likely represents a bone island. IMPRESSION: 1. Mild right perinephric edema with subtle fullness in the right ureter and subtle right periureteric edema. 3 mm stone is identified in the bladder lumen. Together, these features are compatible with recent right renal stone passage to the level of the bladder. 2. Bilateral renal cysts with a 6 x 7 mm nonobstructing stone in the lower pole of the left kidney. 3. Stable bilateral adrenal adenomas. 4. Abdominal aortic atherosclerosis. Electronically Signed   By: Misty Stanley M.D.   On: 01/24/2016 20:46   I have personally reviewed and evaluated these images and lab results as part of my medical decision-making.   EKG Interpretation   Date/Time:  Saturday January 24 2016 14:27:24 EST Ventricular Rate:  49 PR Interval:  184 QRS Duration: 117 QT Interval:  526 QTC Calculation: 475 R Axis:   -67 Text Interpretation:  Sinus bradycardia Ventricular premature complex  Probable left atrial enlargement Left anterior fascicular block LVH with  secondary repolarization abnormality Anterior Q waves, possibly due to LVH  Baseline wander Since last tracing rate slower Confirmed by Eulis Foster  MD,  Havanna Groner [id number]) on 01/24/2016 2:41:19 PM      MDM   Final diagnoses:  Hypertensive urgency  Ureteral colic    Hypertensive urgency, without end organ damage. CT evaluation is consistent with recently passed right renal stone. Doubt serous back till infection, metabolic instability or impending vascular collapse. No evidence for vascular cause to the back pain.  Nursing Notes Reviewed/ Care Coordinated Applicable Imaging Reviewed Interpretation of Laboratory Data incorporated into ED treatment  The patient appears reasonably screened  and/or stabilized for discharge and I doubt any other medical condition or other [facility] requiring further screening,  evaluation, or treatment in the ED at this time prior to discharge.  Plan: Home Medications- add hydralazine orally, base dose started, Norco, prepack to go for possible residual right flank pain; Home Treatments- rest; return here if the recommended treatment, does not improve the symptoms; Recommended follow up- PCP follow-up in 3 days for hydralazine dosing adjustment, as indicated.     Daleen Bo, MD 01/24/16 2127

## 2016-01-24 NOTE — ED Notes (Signed)
Vomited small amount.

## 2016-01-26 MED FILL — Hydrocodone-Acetaminophen Tab 5-325 MG: ORAL | Qty: 6 | Status: AC

## 2016-05-31 ENCOUNTER — Other Ambulatory Visit: Payer: Self-pay

## 2016-05-31 MED ORDER — NITROGLYCERIN 0.4 MG SL SUBL: SUBLINGUAL_TABLET | SUBLINGUAL | Status: DC

## 2016-05-31 NOTE — Telephone Encounter (Signed)
Refill ntg

## 2016-06-01 ENCOUNTER — Telehealth: Payer: Self-pay | Admitting: Internal Medicine

## 2016-06-01 NOTE — Telephone Encounter (Signed)
I reviewed the chart, it does not look like this is my patient. He follows with Dr. Lovena Le apparently. I reviewed the current medication list in EPIC. If he is taking Cozaar at 50 mg once daily, would suggest taking an additional dose today prior to the visit tomorrow. Any acute or progressive symptoms could certainly be evaluated in the ER if he does not improve.

## 2016-06-01 NOTE — Telephone Encounter (Signed)
Patient's BP has been elevated. Scheduled appointment for tomorrow to see ML. Wanted to know if he should take any extra meds until then. / tg

## 2016-06-01 NOTE — Telephone Encounter (Signed)
PT complains of elevated blood pressures ( 220/198,135/98,135/115,130/98) for the past 2 days. He has also been having bad headaches for the past few days. He does not have any dizziness. He does have an appointment with Ermalinda Barrios tomorrow afternoon but is wanting to know if he can take any extra medication to bring his bp down before his appt.? Please advise.

## 2016-06-01 NOTE — Telephone Encounter (Signed)
Let pt know to take an additional 50 mg of Cozarr TODAY and to keep his follow up tomorrow. I let him know if his symptoms get worse to consider going to the ED for evaluation. He voiced understanding.

## 2016-06-02 ENCOUNTER — Encounter: Payer: Self-pay | Admitting: Physician Assistant

## 2016-06-02 ENCOUNTER — Ambulatory Visit (INDEPENDENT_AMBULATORY_CARE_PROVIDER_SITE_OTHER): Payer: Commercial Managed Care - HMO | Admitting: Physician Assistant

## 2016-06-02 VITALS — BP 180/116 | HR 66 | Ht 67.0 in | Wt 145.0 lb

## 2016-06-02 DIAGNOSIS — I2510 Atherosclerotic heart disease of native coronary artery without angina pectoris: Secondary | ICD-10-CM

## 2016-06-02 DIAGNOSIS — I119 Hypertensive heart disease without heart failure: Secondary | ICD-10-CM | POA: Diagnosis not present

## 2016-06-02 DIAGNOSIS — Z720 Tobacco use: Secondary | ICD-10-CM

## 2016-06-02 MED ORDER — LOSARTAN POTASSIUM 50 MG PO TABS: 50.0000 mg | ORAL_TABLET | Freq: Two times a day (BID) | ORAL | Status: DC

## 2016-06-02 MED ORDER — NITROGLYCERIN 0.4 MG SL SUBL: SUBLINGUAL_TABLET | SUBLINGUAL | Status: DC

## 2016-06-02 NOTE — Progress Notes (Signed)
Cardiology Office Note    Date:  06/02/2016   ID:  [name], DOB 1951-07-11, MRN [id number]  PCP:  Alonza Bogus, MD  Cardiologist: Dr. Lovena Le  Chief complaint elevated blood pressures in headaches  History of Present Illness:  [name] is a 65 y.o. male  history of CAD status post MI in 2013 treated with DES to the OM1 EF 60-65%. He also has sinus bradycardia, uncontrolled hypertension and ongoing tobacco abuse and possible trigeminal neuralgia. Last saw Dr. Lovena Le in January 2017 at which his blood pressure is elevated felt secondary to pain from trigeminal neuralgia. He was in the ED 01/2016 with back pain and had a hypertensive urgency with blood pressures as high as 251/142. CT showed a recently passed right renal stone. Hydralazine was added.  Patient comes in today only taking Coreg 25 mg twice a day and losartan 50 mg once daily. He took Her dose last evening and his blood pressure was down to 162/94 this morning. It is back up now but he says it's lower than it's been. He eats out all the time. After having a bologna sandwich his blood pressure was well over 200 and he said it took 2 days to come down. He denies chest pain, palpitations, dizziness or presyncope    Past Medical History  Diagnosis Date  . CAD (coronary artery disease)     a. 08/2012 Inferolateral STEMI/Cath/PCI: LM nl, LAD minor irregs, RI 20p, LCX nl, OM1 100d (Tx w 2.75x64mm Promus DES), RCA nondom, minor irregs, EF60-65%.  . Hypertension   . Tobacco abuse   . Environmental allergies   . Acute MI [facility])     Past Surgical History  Procedure Laterality Date  . Cholecystectomy    . Appendectomy    . Left heart catheterization with coronary angiogram N/A 08/25/2012    Procedure: LEFT HEART CATHETERIZATION WITH CORONARY ANGIOGRAM;  Surgeon: Hillary Bow, MD;  Location: [facility] CATH LAB;  Service: Cardiovascular;  Laterality: N/A;  . Percutaneous coronary stent intervention (pci-s) Left 08/25/2012   Procedure: PERCUTANEOUS CORONARY STENT INTERVENTION (PCI-S);  Surgeon: Hillary Bow, MD;  Location[facility] LAB;  Service: Cardiovascular;  Laterality: Left;  stent x 1 om    Current Medications: Outpatient Prescriptions Prior to Visit  Medication Sig Dispense Refill  . albuterol (PROVENTIL HFA;VENTOLIN HFA) 108 (90 Base) MCG/ACT inhaler Inhale 2 puffs into the lungs every 6 (six) hours as needed for wheezing or shortness of breath.    Marland Kitchen aspirin EC 81 MG EC tablet Take 1 tablet (81 mg total) by mouth daily. (Patient taking differently: Take 81 mg by mouth at bedtime. )    . carvedilol (COREG) 25 MG tablet TAKE ONE-HALF TABLET TWICE DAILY FOR ONEMONTH, THEN TAKE ONE TABLET TWICE DAILY THEREAFTER (Patient taking differently: TAKE ONE TABLET TWICE DAILY) 180 tablet 3  . HYDROcodone-acetaminophen (NORCO/VICODIN) 5-325 MG tablet Take 2 tablets by mouth every 4 (four) hours as needed. 6 tablet 0  . nitroGLYCERIN (NITROSTAT) 0.4 MG SL tablet PLACE ONE TABLET UNDER THE TONGUE EVERY FIVE MINUTES FOR THREE DOSES AS NEEDED FOR CHEST PAIN 25 tablet 3  . prasugrel (EFFIENT) 10 MG TABS Take 1 tablet (10 mg total) by mouth daily. (Patient taking differently: Take 10 mg by mouth at bedtime. ) 30 tablet 6  . hydrALAZINE (APRESOLINE) 10 MG tablet Take 1 tablet (10 mg total) by mouth 4 (four) times daily. 30 tablet 1  . hydrochlorothiazide (HYDRODIURIL) 25 MG tablet Take 1  tablet (25 mg total) by mouth daily. 30 tablet 3  . losartan (COZAAR) 50 MG tablet TAKE ONE (1) TABLET BY MOUTH EVERY DAY 30 tablet 11   No facility-administered medications prior to visit.     Allergies:   Sulfa antibiotics   Social History   Social History  . Marital Status: Married    Spouse Name: N/A  . Number of Children: N/A  . Years of Education: N/A   Social History Main Topics  . Smoking status: Current Every Day Smoker -- 0.30 packs/day  . Smokeless tobacco: None     Comment: Patient is still smoking everday but he is  trying to quit.   . Alcohol Use: No  . Drug Use: No  . Sexual Activity: Not Asked   Other Topics Concern  . None   Social History Narrative     Family History:  The patient's    family history is not on file.   ROS:   Please see the history of present illness.    Review of Systems  Constitution: Negative.  HENT: Positive for headaches.   Cardiovascular: Negative.   Respiratory: Positive for wheezing.   Endocrine: Negative.   Hematologic/Lymphatic: Negative.   Musculoskeletal: Negative.   Gastrointestinal: Negative.   Genitourinary: Positive for decreased libido.   All other systems reviewed and are negative.   PHYSICAL EXAM:   VS:  BP 180/116 mmHg  Pulse 66  Ht 5\' 7"  (1.702 m)  Wt 145 lb (65.772 kg)  BMI 22.71 kg/m2  SpO2 96%  Physical Exam  GEN: Well nourished, well developed, in no acute distress Neck: no JVD, carotid bruits, or masses Cardiac:RRR; positive S4 1/6 systolic murmur at the left sternal border Respiratory:  clear to auscultation bilaterally, normal work of breathing GI: soft, nontender, nondistended, + BS Ext: without cyanosis, clubbing, or edema, Good distal pulses bilaterally MS: no deformity or atrophy Skin: warm and dry, no rash Neuro:  Alert and Oriented x 3, Strength and sensation are intact Psych: euthymic mood, full affect  Wt Readings from Last 3 Encounters:  06/02/16 145 lb (65.772 kg)  01/24/16 148 lb (67.132 kg)  12/04/15 153 lb (69.4 kg)      Studies/Labs Reviewed:   EKG:  EKG is Not ordered today.    Recent Labs: 01/24/2016: BUN 18; Creatinine, Ser 1.49*; Hemoglobin 15.8; Platelets 168; Potassium 3.8; Sodium 140   Lipid Panel    Component Value Date/Time   CHOL 145 08/26/2012 0116   TRIG 84 08/26/2012 0116   HDL 38* 08/26/2012 0116   CHOLHDL 3.8 08/26/2012 0116   VLDL 17 08/26/2012 0116   LDLCALC 90 08/26/2012 0116    Additional studies/ records that were reviewed today include:  Angiographic Findings: 2013  Left  main:  No obstructive disease.   Left Anterior Descending Artery: Large caliber vessel that courses to the apex. There are mild luminal irregularities in the mid vessel. The diagonal branch is moderate sized and has no obstructive disease.   Ramus Intermediate: Large caliber vessel with serial 20% stenoses proximal and mid vessel.    Circumflex Artery:  Large caliber vessel that terminates into a moderate sized obtuse marginal branch. The distal portion of the marginal branch is completely occluded.    Right Coronary Artery: Small, non-dominant vessel with mild plaque.   Left Ventricular Angiogram: LVEF=60-65%.  Impression: 1. Acute inferolateral MI secondary to occluded first OM branch distally 2. Successful PTCA/DES x 1 OM1 3. Mild non-obstructive disease LAD.   4.  Preserved LV systolic function     ASSESSMENT:    1. Hypertensive heart disease without heart failure   2. Coronary artery disease involving native coronary artery of native heart without angina pectoris   3. Tobacco abuse      PLAN:  In order of problems listed above: Hypertensive heart disease without heart failure patient's blood pressure is been elevated for a long time. He is not willing to add another medication currently. I will increase his losartan to 50 mg twice a day. He needs to cut back on the sodium in his diet. Continue Coreg 25 mg twice a day. He is to monitor his blood pressures at home and come back next week for further adjustment. I suspect he will need another medication. We could add back hydralazine but he is unwilling today. Follow-up with Dr. Lovena Le in one month.  CAD stable without angina  Tobacco abuse continues to smoke a half a pack daily. Advised to quit     Medication Adjustments/Labs and Tests Ordered: Current medicines are reviewed at length with the patient today.  Concerns regarding medicines are outlined above.  Medication changes, Labs and Tests ordered today are listed in the  Patient Instructions below. Patient Instructions  Medication Instructions:  INCREASE LOSARTAN to 50 MG TWO TIMES DAILY   Labwork: NONE  Testing/Procedures: NONE  Follow-Up: Your physician recommends that you schedule a follow-up appointment in: [place]   Your physician recommends that you schedule a follow-up appointment in: [place]    Any Other Special Instructions Will Be Listed Below (If Applicable).     If you need a refill on your cardiac medications before your next appointment, please call your pharmacy.       Signed, Ermalinda Barrios, PA-C  06/02/2016 1:26 PM    [facility] [address], [place], [place]  [address] Phone: [phone]; Fax: [phone]

## 2016-06-02 NOTE — Addendum Note (Signed)
Addended by: Debbora Lacrosse R on: 06/02/2016 01:31 PM   Modules accepted: Orders

## 2016-06-02 NOTE — Patient Instructions (Signed)
Medication Instructions:  INCREASE LOSARTAN to 50 MG TWO TIMES DAILY   Labwork: NONE  Testing/Procedures: NONE  Follow-Up: Your physician recommends that you schedule a follow-up appointment in: [place]   Your physician recommends that you schedule a follow-up appointment in: [place]    Any Other Special Instructions Will Be Listed Below (If Applicable).     If you need a refill on your cardiac medications before your next appointment, please call your pharmacy.

## 2016-06-09 ENCOUNTER — Ambulatory Visit (INDEPENDENT_AMBULATORY_CARE_PROVIDER_SITE_OTHER): Payer: Commercial Managed Care - HMO | Admitting: *Deleted

## 2016-06-09 VITALS — BP 174/112 | HR 57

## 2016-06-09 DIAGNOSIS — I10 Essential (primary) hypertension: Secondary | ICD-10-CM | POA: Diagnosis not present

## 2016-06-09 MED ORDER — HYDRALAZINE HCL 25 MG PO TABS: 25.0000 mg | ORAL_TABLET | Freq: Three times a day (TID) | ORAL | 6 refills | Status: DC

## 2016-06-09 NOTE — Patient Instructions (Signed)
Your physician recommends that you schedule a follow-up appointment in: 1 Week for Blood Pressure Check  Your physician has recommended you make the following change in your medication:   Start Hydralazine 25 mg Three times Daily.  If you need a refill on your cardiac medications before your next appointment, please call your pharmacy.  Thank you for choosing [facility]!

## 2016-06-16 ENCOUNTER — Ambulatory Visit (INDEPENDENT_AMBULATORY_CARE_PROVIDER_SITE_OTHER): Payer: Commercial Managed Care - HMO

## 2016-06-16 VITALS — BP 140/80 | HR 58 | Ht 69.0 in | Wt 149.0 lb

## 2016-06-16 DIAGNOSIS — I10 Essential (primary) hypertension: Secondary | ICD-10-CM

## 2016-06-16 NOTE — Progress Notes (Signed)
Pt doing well. No complaints today. He states he has had good bp readings at home as well.

## 2016-08-12 ENCOUNTER — Ambulatory Visit (INDEPENDENT_AMBULATORY_CARE_PROVIDER_SITE_OTHER): Payer: Commercial Managed Care - HMO | Admitting: Internal Medicine

## 2016-08-12 ENCOUNTER — Encounter: Payer: Self-pay | Admitting: Internal Medicine

## 2016-08-12 VITALS — BP 180/120 | HR 65 | Ht 67.0 in | Wt 151.0 lb

## 2016-08-12 DIAGNOSIS — Z720 Tobacco use: Secondary | ICD-10-CM | POA: Diagnosis not present

## 2016-08-12 DIAGNOSIS — I119 Hypertensive heart disease without heart failure: Secondary | ICD-10-CM | POA: Diagnosis not present

## 2016-08-12 DIAGNOSIS — I2510 Atherosclerotic heart disease of native coronary artery without angina pectoris: Secondary | ICD-10-CM

## 2016-08-12 MED ORDER — CLOPIDOGREL BISULFATE 75 MG PO TABS: 75.0000 mg | ORAL_TABLET | Freq: Every day | ORAL | 6 refills | Status: DC

## 2016-08-12 MED ORDER — CARVEDILOL 25 MG PO TABS: 37.5000 mg | ORAL_TABLET | Freq: Two times a day (BID) | ORAL | 6 refills | Status: DC

## 2016-08-12 NOTE — Progress Notes (Signed)
HPI [name] returns today for followup. He is a 65 yo man with sinus bradycarda, CAD, s/p MI approximately 2 years ago, with uncontrolled HTN and ongoing tobacco abuse. He has chronic pain in his gums I think caused by Trigeminal neuralgia. He is down to smoking less than a pack of cigarettes a day. He notes that it now pains him to smoke from his Trigeminal neuralgia. He denies chest pain or sob. No HA. He has not had peripheral edema. His blood pressure remains high at least in part due to his pain on the right side of his pain from the neuralgia. Allergies  Allergen Reactions  . Sulfa Antibiotics Hives     Current Outpatient Prescriptions  Medication Sig Dispense Refill  . albuterol (PROVENTIL HFA;VENTOLIN HFA) 108 (90 Base) MCG/ACT inhaler Inhale 2 puffs into the lungs every 6 (six) hours as needed for wheezing or shortness of breath.    Marland Kitchen aspirin EC 81 MG EC tablet Take 1 tablet (81 mg total) by mouth daily. (Patient taking differently: Take 81 mg by mouth at bedtime. )    . hydrALAZINE (APRESOLINE) 25 MG tablet Take 1 tablet (25 mg total) by mouth 3 (three) times daily. 90 tablet 6  . HYDROcodone-acetaminophen (NORCO/VICODIN) 5-325 MG tablet Take 2 tablets by mouth every 4 (four) hours as needed. 6 tablet 0  . losartan (COZAAR) 50 MG tablet Take 1 tablet (50 mg total) by mouth 2 (two) times daily. 30 tablet 11  . nitroGLYCERIN (NITROSTAT) 0.4 MG SL tablet PLACE ONE TABLET UNDER THE TONGUE EVERY FIVE MINUTES FOR THREE DOSES AS NEEDED FOR CHEST PAIN 25 tablet 3  . carvedilol (COREG) 25 MG tablet Take 1.5 tablets (37.5 mg total) by mouth 2 (two) times daily. 45 tablet 6  . clopidogrel (PLAVIX) 75 MG tablet Take 1 tablet (75 mg total) by mouth daily. 30 tablet 6   No current facility-administered medications for this visit.      Past Medical History:  Diagnosis Date  . Acute MI ([place])   . CAD (coronary artery disease)    a. 08/2012 Inferolateral STEMI/Cath/PCI: LM nl, LAD minor  irregs, RI 20p, LCX nl, OM1 100d (Tx w 2.75x68mm Promus DES), RCA nondom, minor irregs, EF60-65%.  . Environmental allergies   . Hypertension   . Tobacco abuse     ROS:   All systems reviewed and negative except as noted in the HPI.   Past Surgical History:  Procedure Laterality Date  . APPENDECTOMY    . CHOLECYSTECTOMY    . LEFT HEART CATHETERIZATION WITH CORONARY ANGIOGRAM N/A 08/25/2012   Procedure: LEFT HEART CATHETERIZATION WITH CORONARY ANGIOGRAM;  Surgeon: Hillary Bow, MD;  Location: [facility] CATH LAB;  Service: Cardiovascular;  Laterality: N/A;  . PERCUTANEOUS CORONARY STENT INTERVENTION (PCI-S) Left 08/25/2012   Procedure: PERCUTANEOUS CORONARY STENT INTERVENTION (PCI-S);  Surgeon: Hillary Bow, MD;  Location: [facility] CATH LAB;  Service: Cardiovascular;  Laterality: Left;  stent x 1 om     No family history on file.   Social History   Social History  . Marital status: Married    Spouse name: N/A  . Number of children: N/A  . Years of education: N/A   Occupational History  . Not on file.   Social History Main Topics  . Smoking status: Current Every Day Smoker    Packs/day: 0.30  . Smokeless tobacco: Not on file     Comment: Patient is still smoking everday but he is  trying to quit.   . Alcohol use No  . Drug use: No  . Sexual activity: Not on file   Other Topics Concern  . Not on file   Social History Narrative  . No narrative on file     BP (!) 180/120   Pulse 65   Ht 5\' 7"  (1.702 m)   Wt 151 lb (68.5 kg)   SpO2 97%   BMI 23.65 kg/m   Physical Exam:  Well appearing middle aged man, NAD HEENT: Unremarkable Neck:  7 cm JVD, no thyromegally Back:  No CVA tenderness Lungs:  Clear with no wheezes HEART:  Regular rate rhythm, no murmurs, no rubs, no clicks, soft S4. Abd:  soft, positive bowel sounds, no organomegally, no rebound, no guarding Ext:  2 plus pulses, no edema, no cyanosis, no clubbing Skin:  No rashes no nodules Neuro:  CN II  through XII intact, motor grossly intact   ECG - NSR with Lateral STT changes.  Assess/Plan: 1. Uncontrolled HTN - his blood pressure is high in part due to his facial pain. I will increase coreg to 37.5 bid. 2. CAD - he denies anginal symptoms. Will follow. 3. Tobacco abuse -he is down to a half pack a day. He is encouraged to stop smoking.  [name].D.

## 2016-08-12 NOTE — Patient Instructions (Signed)
Your physician wants you to follow-up in: 1 Year with Dr. Lovena Le.  You will receive a reminder letter in the mail two months in advance. If you don't receive a letter, please call our office to schedule the follow-up appointment.  Your physician has recommended you make the following change in your medication:   Start Taking Plavix 75 mg Daily  Increase Coreg to 37.5 mg ( 1 1/2 Tablets) Two Times Daily   Stop Taking Effient   If you need a refill on your cardiac medications before your next appointment, please call your pharmacy.  Thank you for choosing [facility]!

## 2016-12-28 ENCOUNTER — Other Ambulatory Visit: Payer: Self-pay

## 2016-12-28 MED ORDER — CARVEDILOL 25 MG PO TABS: 37.5000 mg | ORAL_TABLET | Freq: Two times a day (BID) | ORAL | 6 refills | Status: DC

## 2016-12-28 NOTE — Telephone Encounter (Signed)
Coreg refilled  Per fax request

## 2017-01-23 IMAGING — CT CT RENAL STONE PROTOCOL
2 of 4 series · 15 of 46 positions shown, 17 images · non-contrast
Comparison: 06/07/2012.

CLINICAL DATA: Right-sided pain since noon today with nausea.

EXAM:
CT ABDOMEN AND PELVIS WITHOUT CONTRAST
TECHNIQUE: Multidetector CT imaging of the abdomen and pelvis was performed
following the standard protocol without IV contrast.

[Series 2: standard/full over (age)lbs 5.0 · axial · 0.66mm/px · z∈[+530,+910]mm · 12 of 90 slices shown, 14 images]
[im 7/90  soft-tissue]
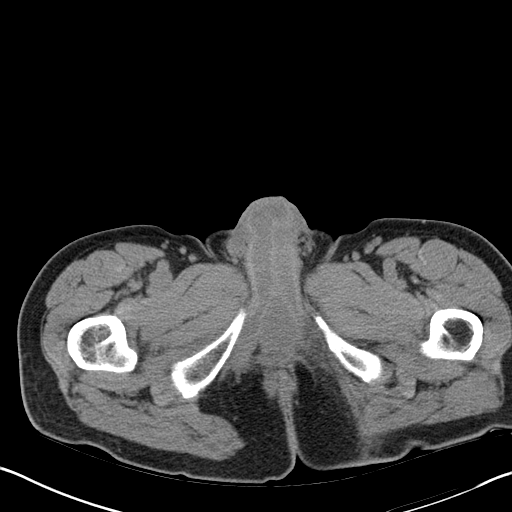
[im 7/90  bone]
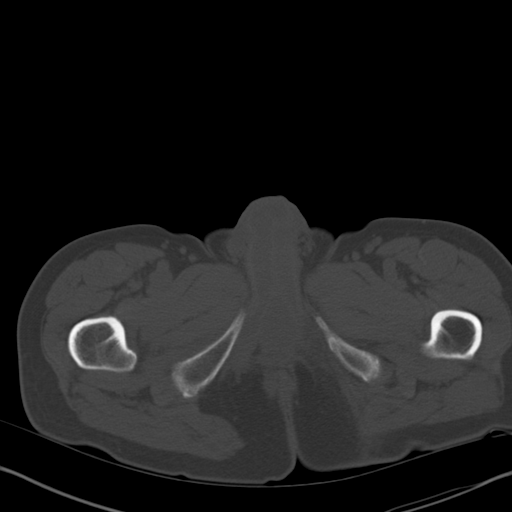
[im 14/90  soft-tissue]
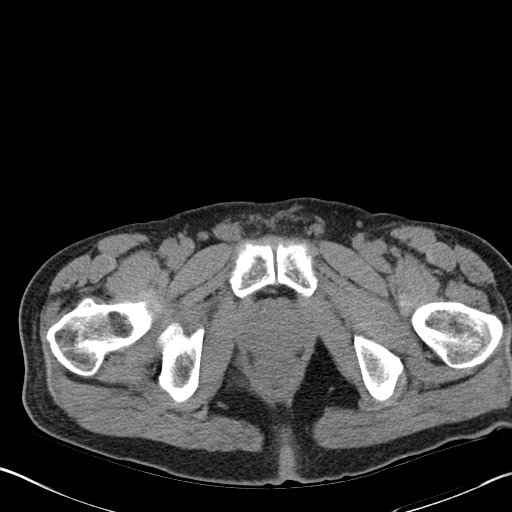
[im 21/90  soft-tissue]
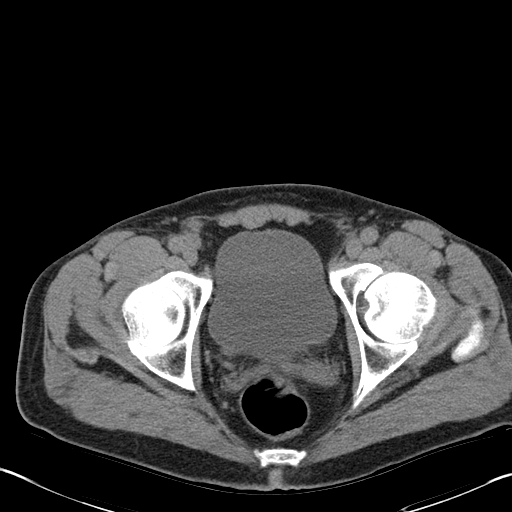
[im 28/90  soft-tissue]
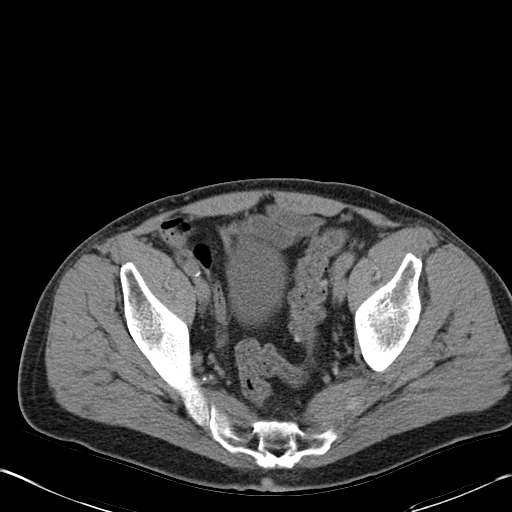
[im 35/90  soft-tissue]
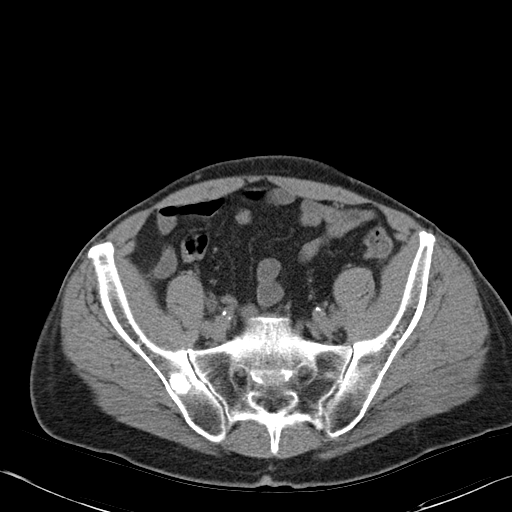
[im 42/90  soft-tissue]
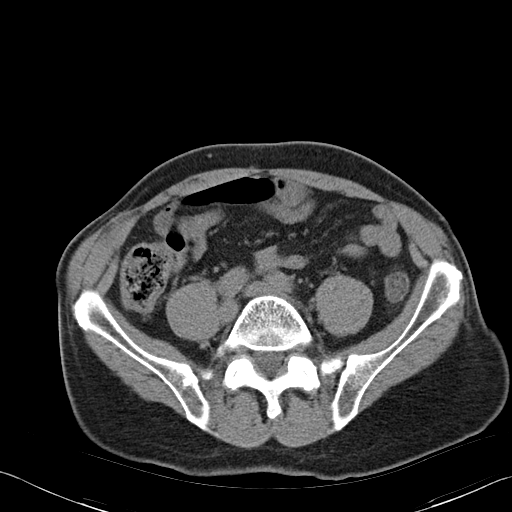
[im 48/90  soft-tissue]
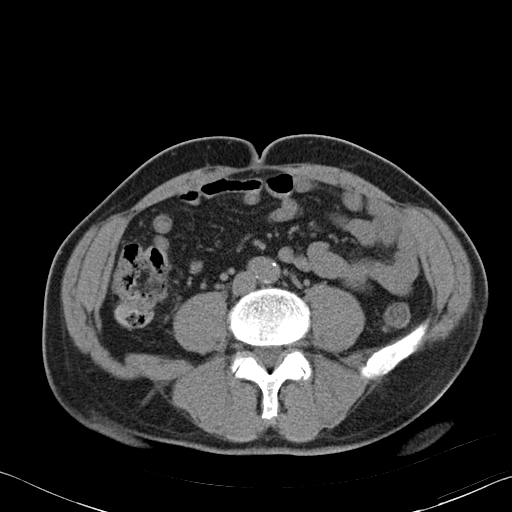
[im 55/90  soft-tissue]
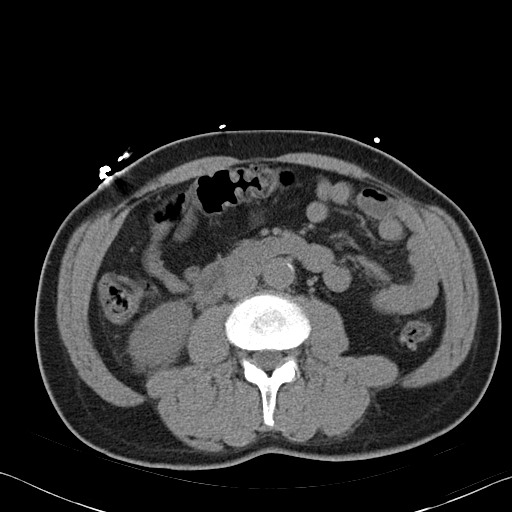
[im 62/90  soft-tissue]
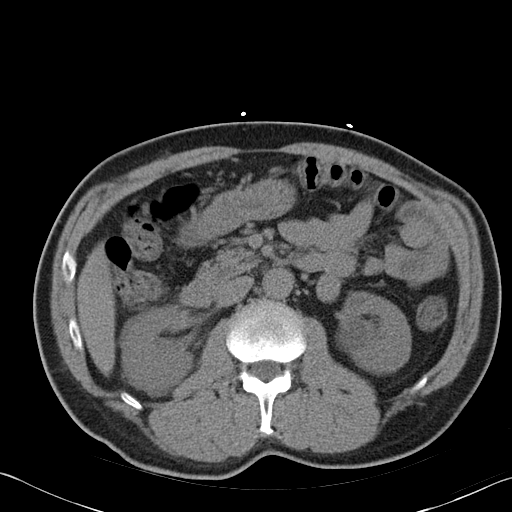
[im 62/90  bone]
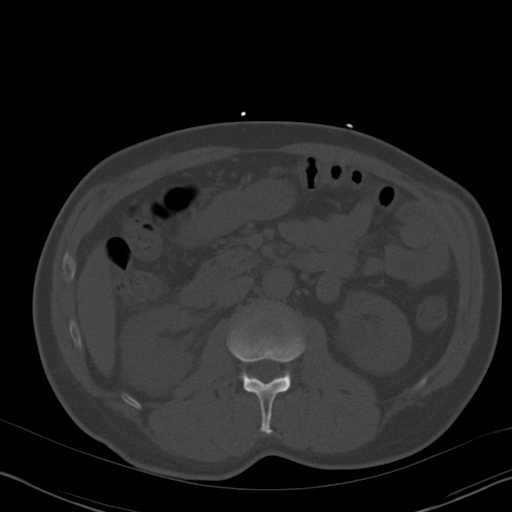
[im 69/90  soft-tissue]
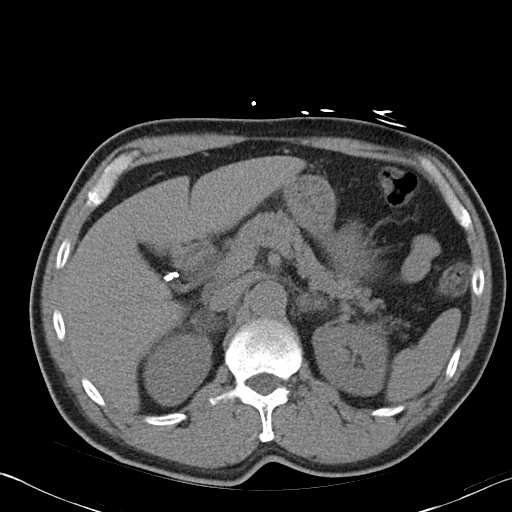
[im 76/90  soft-tissue]
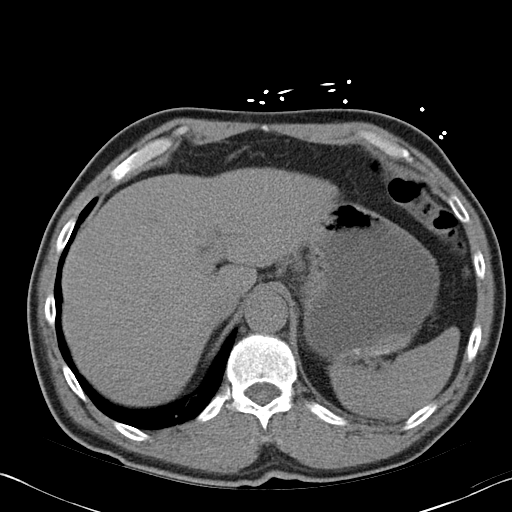
[im 83/90  soft-tissue]
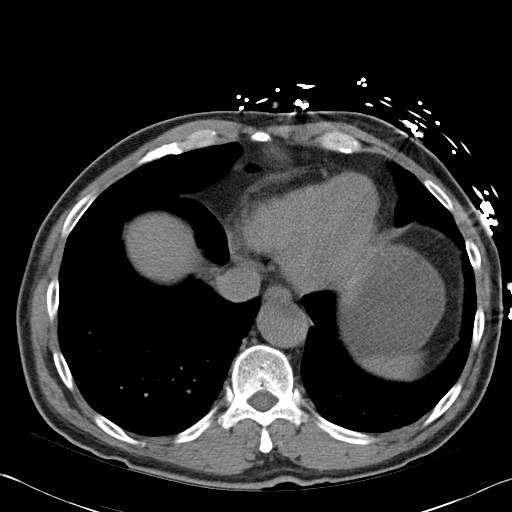

[Series 4: mpr coronal · coronal · 0.64mm/px · 3 of 77 slices shown]
[im 26/77  soft-tissue]
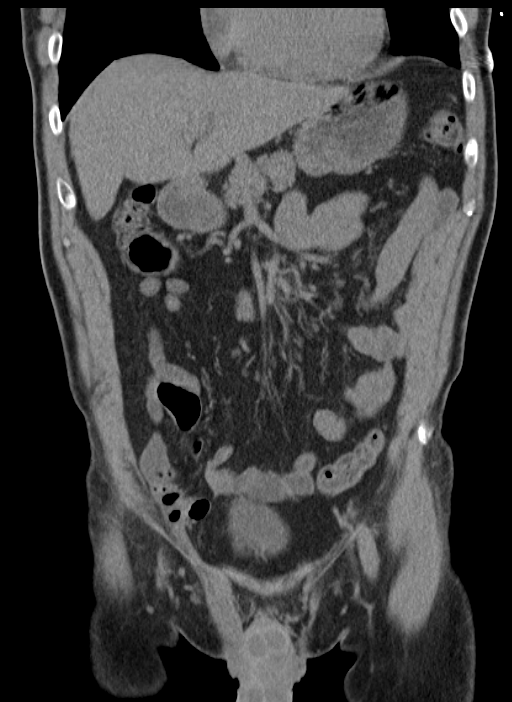
[im 34/77  soft-tissue]
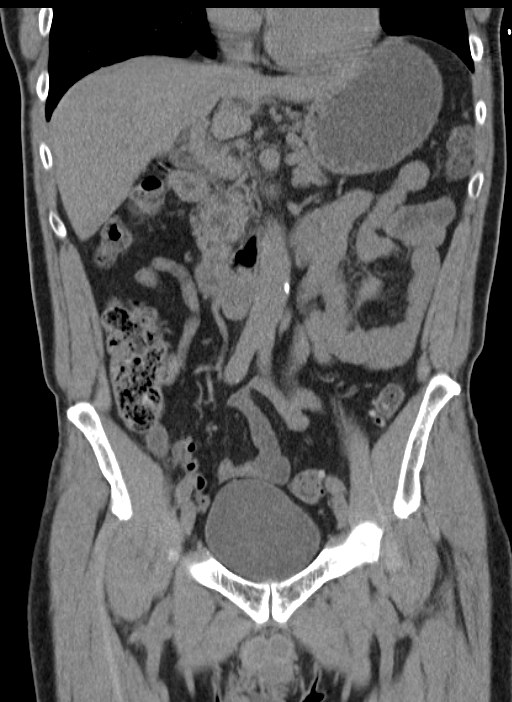
[im 43/77  soft-tissue]
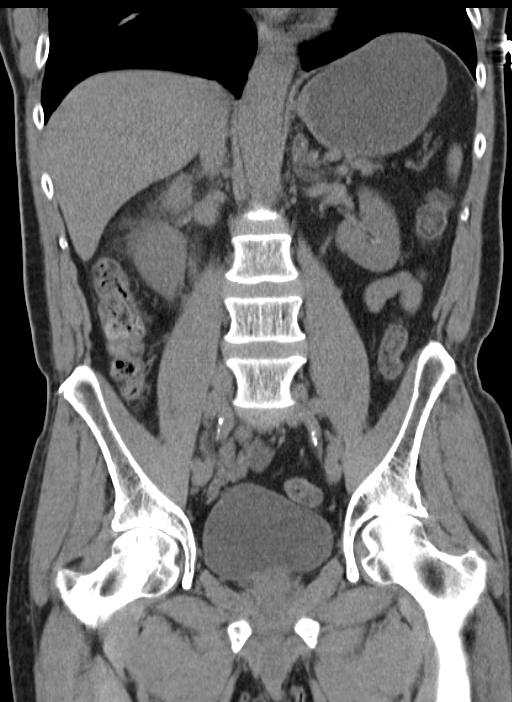

[15 of 46 positions shown; findings below may reference images not displayed]

FINDINGS: Lower chest:  Unremarkable.

Hepatobiliary: No focal abnormality in the liver on this study
without intravenous contrast. No evidence of hepatomegaly.
Gallbladder is surgically absent. No intrahepatic or extrahepatic
biliary dilation.

Pancreas: No focal mass lesion. No dilatation of the main duct. No
intraparenchymal cyst. No peripancreatic edema.

Spleen: No splenomegaly. No focal mass lesion.

Adrenals/Urinary Tract: 1.9 cm right adrenal adenomas stable. 1.8 cm
left adrenal nodule is stable and compatible with adenoma. There is
subtle right perinephric edema without evidence for right renal
stone or right hydronephrosis. There may be some subtle right
perienteric edema without hydroureter. 11 mm water density lesion in
the lower pole the right kidney is compatible with a cyst 3.0 cm
water density lesion in the lower pole of the left kidney was 2.4 cm
previously and remains consistent with a cyst. 6 x 7 mm
nonobstructing stone is seen in the lower pole of the left kidney.
The left ureter is normal. 3 mm stone is identified in the posterior
bladder lumen, not associated with either UVJ.

Stomach/Bowel: Stomach is nondistended. No gastric wall thickening.
No evidence of outlet obstruction. Duodenum is normally positioned
as is the ligament of Treitz. No small bowel wall thickening. No
small bowel dilatation. The terminal ileum is normal.
Nonvisualization of the appendix is consistent with the reported
history of appendectomy. Diverticular changes are noted in the left
colon without evidence of diverticulitis.

Vascular/Lymphatic: There is abdominal aortic atherosclerosis
without aneurysm. There is no gastrohepatic or hepatoduodenal
ligament lymphadenopathy. No intraperitoneal or retroperitoneal
lymphadenopathy. No pelvic sidewall lymphadenopathy.

Reproductive: The prostate gland and seminal vesicles have normal
imaging features.

Other: No intraperitoneal free fluid.

Musculoskeletal: 14 mm sclerotic lesion in the right posterior iliac
bone is stable and likely represents a bone island.
IMPRESSION: 1. Mild right perinephric edema with subtle fullness in the right
ureter and subtle right periureteric edema. 3 mm stone is identified
in the bladder lumen. Together, these features are compatible with
recent right renal stone passage to the level of the bladder.
2. Bilateral renal cysts with a 6 x 7 mm nonobstructing stone in the
lower pole of the left kidney.
3. Stable bilateral adrenal adenomas.
4. Abdominal aortic atherosclerosis.

## 2017-06-06 ENCOUNTER — Other Ambulatory Visit: Payer: Self-pay | Admitting: Internal Medicine

## 2017-06-06 ENCOUNTER — Other Ambulatory Visit: Payer: Self-pay | Admitting: Physician Assistant

## 2017-07-14 ENCOUNTER — Other Ambulatory Visit: Payer: Self-pay

## 2017-07-14 MED ORDER — CLOPIDOGREL BISULFATE 75 MG PO TABS: ORAL_TABLET | ORAL | 3 refills | Status: DC

## 2017-07-14 MED ORDER — LOSARTAN POTASSIUM 50 MG PO TABS: 50.0000 mg | ORAL_TABLET | Freq: Two times a day (BID) | ORAL | 1 refills | Status: DC

## 2017-08-29 DIAGNOSIS — J449 Chronic obstructive pulmonary disease, unspecified: Secondary | ICD-10-CM | POA: Diagnosis not present

## 2017-08-29 DIAGNOSIS — I2510 Atherosclerotic heart disease of native coronary artery without angina pectoris: Secondary | ICD-10-CM | POA: Diagnosis not present

## 2017-08-29 DIAGNOSIS — I10 Essential (primary) hypertension: Secondary | ICD-10-CM | POA: Diagnosis not present

## 2017-08-29 DIAGNOSIS — G500 Trigeminal neuralgia: Secondary | ICD-10-CM | POA: Diagnosis not present

## 2017-10-27 ENCOUNTER — Ambulatory Visit: Payer: Commercial Managed Care - HMO | Admitting: Internal Medicine

## 2017-10-31 ENCOUNTER — Ambulatory Visit: Payer: PPO | Admitting: Internal Medicine

## 2017-10-31 ENCOUNTER — Encounter: Payer: Self-pay | Admitting: Internal Medicine

## 2017-10-31 ENCOUNTER — Ambulatory Visit: Payer: Commercial Managed Care - HMO | Admitting: Internal Medicine

## 2017-10-31 VITALS — BP 180/120 | HR 59 | Ht 67.0 in | Wt 146.0 lb

## 2017-10-31 DIAGNOSIS — I10 Essential (primary) hypertension: Secondary | ICD-10-CM | POA: Diagnosis not present

## 2017-10-31 MED ORDER — HYDROCHLOROTHIAZIDE 25 MG PO TABS: 25.0000 mg | ORAL_TABLET | Freq: Every day | ORAL | 3 refills | Status: DC

## 2017-10-31 MED ORDER — CLONIDINE HCL 0.1 MG PO TABS: 0.1000 mg | ORAL_TABLET | Freq: Two times a day (BID) | ORAL | 11 refills | Status: DC

## 2017-10-31 NOTE — Patient Instructions (Signed)
Medication Instructions:  Your physician has recommended you make the following change in your medication: Start Clonidine 0.1 mg Two Times Daily  Start HCTZ 25 mg Daily  Stop Taking Hydralazine     Labwork: Your physician recommends that you return for lab work in: 2 Weeks    Testing/Procedures: NONE   Follow-Up: Your physician recommends that you schedule a follow-up appointment in: 3 Months    Any Other Special Instructions Will Be Listed Below (If Applicable).     If you need a refill on your cardiac medications before your next appointment, please call your pharmacy. Thank you for choosing [facility]!

## 2017-10-31 NOTE — Progress Notes (Signed)
HPI  [name] returns today after a nearly 2-year absence from our EP clinic.  He is a 66 year old man with a history of sinus node dysfunction, prior myocardial infarction, difficult to control hypertension, ongoing tobacco abuse, and trigeminal neuralgia.  Over the last several weeks, he has trigeminal neuralgia pain has intensified.  Taking a deep breath will trigger the pain.  The patient's blood pressure continues uncontrolled.  He denies syncope, and has had no chest pain or shortness of breath.  He has no heart failure symptoms.  Unfortunately he continues to smoke. Allergies  Allergen Reactions  . Sulfa Antibiotics Hives     Current Outpatient Medications  Medication Sig Dispense Refill  . albuterol (PROVENTIL HFA;VENTOLIN HFA) 108 (90 Base) MCG/ACT inhaler Inhale 2 puffs into the lungs every 6 (six) hours as needed for wheezing or shortness of breath.    Marland Kitchen aspirin EC 81 MG EC tablet Take 1 tablet (81 mg total) by mouth daily. (Patient taking differently: Take 81 mg by mouth at bedtime. )    . carvedilol (COREG) 25 MG tablet Take 1.5 tablets (37.5 mg total) by mouth 2 (two) times daily. 90 tablet 6  . clopidogrel (PLAVIX) 75 MG tablet TAKE ONE (1) TABLET BY MOUTH EVERY DAY 90 tablet 3  . hydrALAZINE (APRESOLINE) 25 MG tablet Take 1 tablet (25 mg total) by mouth 3 (three) times daily. 90 tablet 6  . HYDROcodone-acetaminophen (NORCO/VICODIN) 5-325 MG tablet Take 2 tablets by mouth every 4 (four) hours as needed. 6 tablet 0  . losartan (COZAAR) 50 MG tablet Take 1 tablet (50 mg total) by mouth 2 (two) times daily. 180 tablet 1  . nitroGLYCERIN (NITROSTAT) 0.4 MG SL tablet PLACE ONE TABLET UNDER THE TONGUE EVERY FIVE MINUTES FOR THREE DOSES AS NEEDED FOR CHEST PAIN 25 tablet 3   No current facility-administered medications for this visit.      Past Medical History:  Diagnosis Date  . Acute MI ([place])   . CAD (coronary artery disease)    a. 08/2012 Inferolateral STEMI/Cath/PCI:  LM nl, LAD minor irregs, RI 20p, LCX nl, OM1 100d (Tx w 2.75x75mm Promus DES), RCA nondom, minor irregs, EF60-65%.  . Environmental allergies   . Hypertension   . Tobacco abuse     ROS:   All systems reviewed and negative except as noted in the HPI.   Past Surgical History:  Procedure Laterality Date  . APPENDECTOMY    . CHOLECYSTECTOMY    . LEFT HEART CATHETERIZATION WITH CORONARY ANGIOGRAM N/A 08/25/2012   Procedure: LEFT HEART CATHETERIZATION WITH CORONARY ANGIOGRAM;  Surgeon: Hillary Bow, MD;  Location: [facility] CATH LAB;  Service: Cardiovascular;  Laterality: N/A;  . PERCUTANEOUS CORONARY STENT INTERVENTION (PCI-S) Left 08/25/2012   Procedure: PERCUTANEOUS CORONARY STENT INTERVENTION (PCI-S);  Surgeon: Hillary Bow, MD;  Location: [facility] CATH LAB;  Service: Cardiovascular;  Laterality: Left;  stent x 1 om     Family History  Problem Relation Age of Onset  . Hypertension Mother   . Hypertension Father   . Stroke Father      Social History   Socioeconomic History  . Marital status: Married    Spouse name: Not on file  . Number of children: Not on file  . Years of education: Not on file  . Highest education level: Not on file  Social Needs  . Financial resource strain: Not on file  . Food insecurity - worry: Not on file  . Food insecurity -  inability: Not on file  . Transportation needs - medical: Not on file  . Transportation needs - non-medical: Not on file  Occupational History  . Not on file  Tobacco Use  . Smoking status: Current Every Day Smoker    Packs/day: 0.30  . Smokeless tobacco: Never Used  . Tobacco comment: Patient is still smoking everday but he is trying to quit.   Substance and Sexual Activity  . Alcohol use: No    Alcohol/week: 0.0 oz  . Drug use: No  . Sexual activity: Not on file  Other Topics Concern  . Not on file  Social History Narrative  . Not on file     BP (!) 180/120   Pulse (!) 59   Ht 5\' 7"  (1.702 m)   Wt 146 lb (66.2  kg)   SpO2 95%   BMI 22.87 kg/m   Physical Exam:  Well appearing 66 year old man, NAD HEENT: Unremarkable Neck: 6 cm JVD, no thyromegally Lymphatics:  No adenopathy Back:  No CVA tenderness Lungs:  Clear, with no wheezes, rales, or rhonchi HEART:  Regular rate rhythm, no murmurs, no rubs, no clicks, soft S4 gallop is present Abd:  soft, positive bowel sounds, no organomegally, no rebound, no guarding Ext:  2 plus pulses, no edema, no cyanosis, no clubbing Skin:  No rashes no nodules Neuro:  CN II through XII intact, motor grossly intact  EKG - normal sinus rhythm with left ventricular hypertrophy, left axis deviation   Assess/Plan: 1.  Uncontrolled hypertension -the patient thinks that his hydralazine is making his trigeminal neuralgia worse.  I have asked the patient to stop hydralazine, we will add it in diuretic, HCTZ, and add clonidine. 2.  Tobacco abuse -I strongly encouraged the patient to stop smoking. 3.  Dyslipidemia -he was previously on statin therapy but this is been discontinued. 4.  Coronary artery disease -he is status post MI by history, but has no anginal symptoms.   Crissie Sickles, MD

## 2017-11-01 ENCOUNTER — Telehealth: Payer: Self-pay | Admitting: Internal Medicine

## 2017-11-01 MED ORDER — AMLODIPINE BESYLATE 5 MG PO TABS: 5.0000 mg | ORAL_TABLET | Freq: Every day | ORAL | 3 refills | Status: DC

## 2017-11-01 NOTE — Telephone Encounter (Signed)
Will forward to ordering MD for dispo

## 2017-11-01 NOTE — Telephone Encounter (Signed)
New Message   Pt c/o medication issue:  1. Name of Medication: hydrochlorothiazide  2. How are you currently taking this medication (dosage and times per day)? 25MG   3. Are you having a reaction (difficulty breathing--STAT)? NO  4. What is your medication issue? Patient is allergic to sulfur   [name] from the pharmacy called advised pt is allergic to sulfur. Pt will need a different medication.

## 2017-11-01 NOTE — Telephone Encounter (Signed)
Left message for Pt.  Per Dr. Lovena Le- DC HCTZ d/t sulfa allergy Change to amlodipine 5 mg po daily. Prescription sent to [facility]. Left this nurse name and # if any questions.

## 2017-11-07 ENCOUNTER — Other Ambulatory Visit: Payer: Self-pay | Admitting: Internal Medicine

## 2018-02-02 ENCOUNTER — Ambulatory Visit: Payer: PPO | Admitting: Internal Medicine

## 2018-02-02 ENCOUNTER — Encounter: Payer: Self-pay | Admitting: Internal Medicine

## 2018-02-02 VITALS — BP 148/100 | HR 64 | Ht 66.0 in | Wt 147.2 lb

## 2018-02-02 DIAGNOSIS — I2510 Atherosclerotic heart disease of native coronary artery without angina pectoris: Secondary | ICD-10-CM | POA: Diagnosis not present

## 2018-02-02 DIAGNOSIS — I119 Hypertensive heart disease without heart failure: Secondary | ICD-10-CM | POA: Diagnosis not present

## 2018-02-02 MED ORDER — CLONIDINE HCL 0.1 MG PO TABS: ORAL_TABLET | ORAL | 11 refills | Status: DC

## 2018-02-02 NOTE — Patient Instructions (Signed)
Medication Instructions:  Your physician has recommended you make the following change in your medication:  Take Clonidine 0.1 mg in the morning and 0.2 mg in the evening    Labwork: NONE   Testing/Procedures: NONE   Follow-Up: Your physician wants you to follow-up in: 1 year with Dr. Lovena Le. You will receive a reminder letter in the mail two months in advance. If you don't receive a letter, please call our office to schedule the follow-up appointment.   Any Other Special Instructions Will Be Listed Below (If Applicable).     If you need a refill on your cardiac medications before your next appointment, please call your pharmacy. Thank you for choosing [facility]!

## 2018-02-02 NOTE — Progress Notes (Signed)
HPI Mr. Bartel returns today for followup of HTN, tobacco abuse, CAD, and trigeminal neuralgia.  He is a 67 year old man with a history of sinus node dysfunction, prior myocardial infarction, difficult to control hypertension, ongoing tobacco abuse, and trigeminal neuralgia.  Over the last several months, he has trigeminal neuralgia pain has improved. The patient's blood pressure is a little better.  He denies syncope, and has had no chest pain or shortness of breath.  He has no heart failure symptoms.  Unfortunately he continues to smoke. He has had some problems with ED but does not want to try sildanefil.  Allergies  Allergen Reactions  . Sulfa Antibiotics Hives     Current Outpatient Medications  Medication Sig Dispense Refill  . albuterol (PROVENTIL HFA;VENTOLIN HFA) 108 (90 Base) MCG/ACT inhaler Inhale 2 puffs into the lungs every 6 (six) hours as needed for wheezing or shortness of breath.    Marland Kitchen amLODipine (NORVASC) 5 MG tablet Take 1 tablet (5 mg total) by mouth daily. 90 tablet 3  . aspirin EC 81 MG EC tablet Take 1 tablet (81 mg total) by mouth daily. (Patient taking differently: Take 81 mg by mouth at bedtime. )    . carvedilol (COREG) 25 MG tablet TAKE ONE AND ONE-HALF TABLETS BY MOUTH TWO TIMES DAILY 90 tablet 11  . clopidogrel (PLAVIX) 75 MG tablet TAKE ONE (1) TABLET BY MOUTH EVERY DAY 90 tablet 3  . HYDROcodone-acetaminophen (NORCO/VICODIN) 5-325 MG tablet Take 2 tablets by mouth every 4 (four) hours as needed. 6 tablet 0  . losartan (COZAAR) 50 MG tablet Take 1 tablet (50 mg total) by mouth 2 (two) times daily. 180 tablet 1  . nitroGLYCERIN (NITROSTAT) 0.4 MG SL tablet PLACE ONE TABLET UNDER THE TONGUE EVERY FIVE MINUTES FOR THREE DOSES AS NEEDED FOR CHEST PAIN 25 tablet 3   No current facility-administered medications for this visit.      Past Medical History:  Diagnosis Date  . Acute MI ([place])   . CAD (coronary artery disease)    a. 08/2012 Inferolateral  STEMI/Cath/PCI: LM nl, LAD minor irregs, RI 20p, LCX nl, OM1 100d (Tx w 2.75x74mm Promus DES), RCA nondom, minor irregs, EF60-65%.  . Environmental allergies   . Hypertension   . Tobacco abuse     ROS:   All systems reviewed and negative except as noted in the HPI.   Past Surgical History:  Procedure Laterality Date  . APPENDECTOMY    . CHOLECYSTECTOMY    . LEFT HEART CATHETERIZATION WITH CORONARY ANGIOGRAM N/A 08/25/2012   Procedure: LEFT HEART CATHETERIZATION WITH CORONARY ANGIOGRAM;  Surgeon: Hillary Bow, MD;  Location: [facility] CATH LAB;  Service: Cardiovascular;  Laterality: N/A;  . PERCUTANEOUS CORONARY STENT INTERVENTION (PCI-S) Left 08/25/2012   Procedure: PERCUTANEOUS CORONARY STENT INTERVENTION (PCI-S);  Surgeon: Hillary Bow, MD;  Location: [facility] CATH LAB;  Service: Cardiovascular;  Laterality: Left;  stent x 1 om     Family History  Problem Relation Age of Onset  . Hypertension Mother   . Hypertension Father   . Stroke Father      Social History   Socioeconomic History  . Marital status: Married    Spouse name: Not on file  . Number of children: Not on file  . Years of education: Not on file  . Highest education level: Not on file  Occupational History  . Not on file  Social Needs  . Financial resource strain: Not on file  . Food  insecurity:    Worry: Not on file    Inability: Not on file  . Transportation needs:    Medical: Not on file    Non-medical: Not on file  Tobacco Use  . Smoking status: Current Every Day Smoker    Packs/day: 0.30  . Smokeless tobacco: Never Used  . Tobacco comment: Patient is still smoking everday but he is trying to quit.   Substance and Sexual Activity  . Alcohol use: No    Alcohol/week: 0.0 oz  . Drug use: No  . Sexual activity: Not on file  Lifestyle  . Physical activity:    Days per week: Not on file    Minutes per session: Not on file  . Stress: Not on file  Relationships  . Social connections:    Talks on  phone: Not on file    Gets together: Not on file    Attends religious service: Not on file    Active member of club or organization: Not on file    Attends meetings of clubs or organizations: Not on file    Relationship status: Not on file  . Intimate partner violence:    Fear of current or ex partner: Not on file    Emotionally abused: Not on file    Physically abused: Not on file    Forced sexual activity: Not on file  Other Topics Concern  . Not on file  Social History Narrative  . Not on file     BP (!) 148/100   Pulse 64   Ht 5\' 6"  (1.676 m)   Wt 147 lb 3.2 oz (66.8 kg)   SpO2 97%   BMI 23.76 kg/m   Physical Exam:  Well appearing 67 yo man, NAD HEENT: Unremarkable Neck:  6 cm JVD, no thyromegally, no bruit Lymphatics:  No adenopathy Back:  No CVA tenderness Lungs:  Clear with no wheezes HEART:  Regular rate rhythm, no murmurs, no rubs, no clicks Abd:  soft, positive bowel sounds, no organomegally, no rebound, no guarding Ext:  2 plus pulses, no edema, no cyanosis, no clubbing Skin:  No rashes no nodules Neuro:  CN II through XII intact, motor grossly intact  DEVICE  Normal device function.  See PaceArt for details.   Assess/Plan: 1. HTN heart disease - his blood pressure is better today. I have asked him to uptitrate his clonidine to 0.1 mg in a.m and 0.2 mg in p.m. 2. ED - I have offered him sildanefil but he would like to hold off on this for now. 3. Trigeminal neuralgia - his symptoms have improved. He will continue his current meds. 4. Tobacco abuse - I have strongly encouraged the patient to stop smoking.  Mikle Bosworth.D.

## 2018-03-04 ENCOUNTER — Other Ambulatory Visit: Payer: Self-pay | Admitting: Internal Medicine

## 2018-03-08 ENCOUNTER — Other Ambulatory Visit: Payer: Self-pay

## 2018-03-08 ENCOUNTER — Encounter (HOSPITAL_COMMUNITY): Payer: Self-pay | Admitting: Emergency Medicine

## 2018-03-08 ENCOUNTER — Emergency Department (HOSPITAL_COMMUNITY): Payer: PPO

## 2018-03-08 ENCOUNTER — Inpatient Hospital Stay (HOSPITAL_COMMUNITY)
Admission: EM | Disposition: A | Discharge status: Home / Self Care | Payer: Self-pay | Source: Home / Self Care | Attending: Cardiology

## 2018-03-08 ENCOUNTER — Inpatient Hospital Stay (HOSPITAL_COMMUNITY)
Admission: EM | Admit: 2018-03-08 | Discharge: 2018-03-10 | DRG: 247 | Disposition: A | Discharge status: Home / Self Care | Payer: PPO | Attending: Cardiology | Admitting: Cardiology

## 2018-03-08 DIAGNOSIS — E876 Hypokalemia: Secondary | ICD-10-CM | POA: Diagnosis not present

## 2018-03-08 DIAGNOSIS — Z7982 Long term (current) use of aspirin: Secondary | ICD-10-CM

## 2018-03-08 DIAGNOSIS — J9801 Acute bronchospasm: Secondary | ICD-10-CM | POA: Diagnosis not present

## 2018-03-08 DIAGNOSIS — F17210 Nicotine dependence, cigarettes, uncomplicated: Secondary | ICD-10-CM | POA: Diagnosis present

## 2018-03-08 DIAGNOSIS — I25110 Atherosclerotic heart disease of native coronary artery with unstable angina pectoris: Secondary | ICD-10-CM | POA: Diagnosis not present

## 2018-03-08 DIAGNOSIS — I10 Essential (primary) hypertension: Secondary | ICD-10-CM | POA: Diagnosis not present

## 2018-03-08 DIAGNOSIS — I25119 Atherosclerotic heart disease of native coronary artery with unspecified angina pectoris: Secondary | ICD-10-CM | POA: Diagnosis not present

## 2018-03-08 DIAGNOSIS — Z720 Tobacco use: Secondary | ICD-10-CM | POA: Diagnosis present

## 2018-03-08 DIAGNOSIS — J40 Bronchitis, not specified as acute or chronic: Secondary | ICD-10-CM | POA: Diagnosis not present

## 2018-03-08 DIAGNOSIS — Z95820 Peripheral vascular angioplasty status with implants and grafts: Secondary | ICD-10-CM

## 2018-03-08 DIAGNOSIS — R0789 Other chest pain: Secondary | ICD-10-CM | POA: Diagnosis not present

## 2018-03-08 DIAGNOSIS — Z882 Allergy status to sulfonamides status: Secondary | ICD-10-CM

## 2018-03-08 DIAGNOSIS — I252 Old myocardial infarction: Secondary | ICD-10-CM | POA: Diagnosis not present

## 2018-03-08 DIAGNOSIS — I214 Non-ST elevation (NSTEMI) myocardial infarction: Principal | ICD-10-CM | POA: Diagnosis present

## 2018-03-08 DIAGNOSIS — Z8249 Family history of ischemic heart disease and other diseases of the circulatory system: Secondary | ICD-10-CM | POA: Diagnosis not present

## 2018-03-08 DIAGNOSIS — E785 Hyperlipidemia, unspecified: Secondary | ICD-10-CM | POA: Diagnosis not present

## 2018-03-08 DIAGNOSIS — I2510 Atherosclerotic heart disease of native coronary artery without angina pectoris: Secondary | ICD-10-CM | POA: Diagnosis not present

## 2018-03-08 DIAGNOSIS — Z79899 Other long term (current) drug therapy: Secondary | ICD-10-CM | POA: Diagnosis not present

## 2018-03-08 DIAGNOSIS — Z955 Presence of coronary angioplasty implant and graft: Secondary | ICD-10-CM

## 2018-03-08 DIAGNOSIS — R079 Chest pain, unspecified: Secondary | ICD-10-CM | POA: Diagnosis not present

## 2018-03-08 DIAGNOSIS — Z7902 Long term (current) use of antithrombotics/antiplatelets: Secondary | ICD-10-CM | POA: Diagnosis not present

## 2018-03-08 DIAGNOSIS — J209 Acute bronchitis, unspecified: Secondary | ICD-10-CM

## 2018-03-08 HISTORY — DX: Personal history of other diseases of the musculoskeletal system and connective tissue: Z87.39

## 2018-03-08 HISTORY — DX: Non-ST elevation (NSTEMI) myocardial infarction: I21.4

## 2018-03-08 HISTORY — DX: Personal history of other diseases of the digestive system: Z87.19

## 2018-03-08 HISTORY — PX: LEFT HEART CATH AND CORONARY ANGIOGRAPHY: CATH118249

## 2018-03-08 HISTORY — DX: Peripheral vascular angioplasty status with implants and grafts: Z95.820

## 2018-03-08 HISTORY — DX: Gastro-esophageal reflux disease without esophagitis: K21.9

## 2018-03-08 HISTORY — DX: Personal history of urinary calculi: Z87.442

## 2018-03-08 HISTORY — DX: Essential (primary) hypertension: I10

## 2018-03-08 HISTORY — DX: Unspecified chronic bronchitis: J42

## 2018-03-08 HISTORY — PX: CORONARY STENT INTERVENTION: CATH118234

## 2018-03-08 LAB — I-STAT TROPONIN, ED: TROPONIN I, POC: 0.01 ng/mL (ref 0.00–0.08)

## 2018-03-08 LAB — CBC WITH DIFFERENTIAL/PLATELET
Basophils Absolute: 0.1 10*3/uL (ref 0.0–0.1)
Basophils Relative: 1 %
EOS ABS: 0.4 10*3/uL (ref 0.0–0.7)
EOS PCT: 4 %
HCT: 41.1 % (ref 39.0–52.0)
Hemoglobin: 13.3 g/dL (ref 13.0–17.0)
LYMPHS ABS: 2.4 10*3/uL (ref 0.7–4.0)
LYMPHS PCT: 26 %
MCH: 30.6 pg (ref 26.0–34.0)
MCHC: 32.4 g/dL (ref 30.0–36.0)
MCV: 94.5 fL (ref 78.0–100.0)
MONO ABS: 0.8 10*3/uL (ref 0.1–1.0)
MONOS PCT: 8 %
Neutro Abs: 5.5 10*3/uL (ref 1.7–7.7)
Neutrophils Relative %: 61 %
PLATELETS: 229 10*3/uL (ref 150–400)
RBC: 4.35 MIL/uL (ref 4.22–5.81)
RDW: 13.2 % (ref 11.5–15.5)
WBC: 9.1 10*3/uL (ref 4.0–10.5)

## 2018-03-08 LAB — CBC
HEMATOCRIT: 38.8 % — AB (ref 39.0–52.0)
Hemoglobin: 13.1 g/dL (ref 13.0–17.0)
MCH: 31.3 pg (ref 26.0–34.0)
MCHC: 33.8 g/dL (ref 30.0–36.0)
MCV: 92.6 fL (ref 78.0–100.0)
PLATELETS: 192 10*3/uL (ref 150–400)
RBC: 4.19 MIL/uL — AB (ref 4.22–5.81)
RDW: 13.6 % (ref 11.5–15.5)
WBC: 9.4 10*3/uL (ref 4.0–10.5)

## 2018-03-08 LAB — TROPONIN I
Troponin I: <0.03 ng/mL (ref <0.03)
Troponin I: 0.13 ng/mL (ref <0.03)
Troponin I: 2.96 ng/mL (ref <0.03)
Troponin I: 5.33 ng/mL (ref <0.03)

## 2018-03-08 LAB — COMPREHENSIVE METABOLIC PANEL
ALBUMIN: 3.6 g/dL (ref 3.5–5.0)
ALK PHOS: 104 U/L (ref 38–126)
ALT: 18 U/L (ref 17–63)
ANION GAP: 10 (ref 5–15)
AST: 18 U/L (ref 15–41)
BILIRUBIN TOTAL: 0.6 mg/dL (ref 0.3–1.2)
BUN: 13 mg/dL (ref 6–20)
CALCIUM: 8.7 mg/dL — AB (ref 8.9–10.3)
CO2: 24 mmol/L (ref 22–32)
CREATININE: 1.1 mg/dL (ref 0.61–1.24)
Chloride: 109 mmol/L (ref 101–111)
GFR calc Af Amer: >60 mL/min (ref >60)
GFR calc non Af Amer: >60 mL/min (ref >60)
GLUCOSE: 115 mg/dL — AB (ref 65–99)
Potassium: 3 mmol/L — ABNORMAL LOW (ref 3.5–5.1)
Sodium: 143 mmol/L (ref 135–145)
TOTAL PROTEIN: 7.1 g/dL (ref 6.5–8.1)

## 2018-03-08 LAB — CREATININE, SERUM
Creatinine, Ser: 1.07 mg/dL (ref 0.61–1.24)
GFR calc non Af Amer: >60 mL/min (ref >60)

## 2018-03-08 LAB — BRAIN NATRIURETIC PEPTIDE: B Natriuretic Peptide: 65 pg/mL (ref 0.0–100.0)

## 2018-03-08 LAB — POCT ACTIVATED CLOTTING TIME
Activated Clotting Time: 268 seconds
Activated Clotting Time: 323 seconds

## 2018-03-08 LAB — GLUCOSE, CAPILLARY: GLUCOSE-CAPILLARY: 95 mg/dL (ref 65–99)

## 2018-03-08 SURGERY — LEFT HEART CATH AND CORONARY ANGIOGRAPHY
Anesthesia: LOCAL

## 2018-03-08 MED ORDER — ASPIRIN 81 MG PO CHEW: 81.0000 mg | CHEWABLE_TABLET | Freq: Every day | ORAL | Status: DC

## 2018-03-08 MED ORDER — HYDRALAZINE HCL 20 MG/ML IJ SOLN: 5.0000 mg | INTRAMUSCULAR | Status: AC | PRN

## 2018-03-08 MED ORDER — HEPARIN SODIUM (PORCINE) 5000 UNIT/ML IJ SOLN: 5000.0000 [IU] | Freq: Three times a day (TID) | INTRAMUSCULAR | Status: DC

## 2018-03-08 MED ORDER — CLOPIDOGREL BISULFATE 75 MG PO TABS
ORAL_TABLET | ORAL | Status: AC
  Filled 2018-03-08: qty 1

## 2018-03-08 MED ORDER — LIDOCAINE HCL (PF) 1 % IJ SOLN
INTRAMUSCULAR | Status: AC
  Filled 2018-03-08: qty 30

## 2018-03-08 MED ORDER — NITROGLYCERIN 0.4 MG SL SUBL: 0.4000 mg | SUBLINGUAL_TABLET | SUBLINGUAL | Status: DC | PRN

## 2018-03-08 MED ORDER — SODIUM CHLORIDE 0.9% FLUSH
3.0000 mL | Freq: Two times a day (BID) | INTRAVENOUS | Status: DC
  Administered 2018-03-08 – 2018-03-10 (×4): 3 mL via INTRAVENOUS

## 2018-03-08 MED ORDER — MIDAZOLAM HCL 2 MG/2ML IJ SOLN
INTRAMUSCULAR | Status: AC
  Filled 2018-03-08: qty 2

## 2018-03-08 MED ORDER — HEPARIN SODIUM (PORCINE) 1000 UNIT/ML IJ SOLN
INTRAMUSCULAR | Status: AC
  Filled 2018-03-08: qty 1

## 2018-03-08 MED ORDER — CARVEDILOL 12.5 MG PO TABS
25.0000 mg | ORAL_TABLET | Freq: Two times a day (BID) | ORAL | Status: DC
  Administered 2018-03-08: 13:00:00 25 mg via ORAL
  Filled 2018-03-08: qty 2

## 2018-03-08 MED ORDER — ASPIRIN EC 81 MG PO TBEC
81.0000 mg | DELAYED_RELEASE_TABLET | Freq: Every day | ORAL | Status: DC
  Administered 2018-03-09 – 2018-03-10 (×2): 81 mg via ORAL
  Filled 2018-03-08 (×2): qty 1

## 2018-03-08 MED ORDER — HEPARIN (PORCINE) IN NACL 2-0.9 UNITS/ML
INTRAMUSCULAR | Status: AC | PRN
  Administered 2018-03-08 (×2): 500 mL

## 2018-03-08 MED ORDER — HYDROCHLOROTHIAZIDE 12.5 MG PO CAPS
12.5000 mg | ORAL_CAPSULE | Freq: Every day | ORAL | Status: DC
  Administered 2018-03-08 – 2018-03-10 (×3): 12.5 mg via ORAL
  Filled 2018-03-08 (×3): qty 1

## 2018-03-08 MED ORDER — NITROGLYCERIN IN D5W 200-5 MCG/ML-% IV SOLN
5.0000 ug/min | Freq: Once | INTRAVENOUS | Status: AC
  Administered 2018-03-08: 5 ug/min via INTRAVENOUS
  Filled 2018-03-08: qty 250

## 2018-03-08 MED ORDER — IOPAMIDOL (ISOVUE-370) INJECTION 76%
INTRAVENOUS | Status: DC | PRN
  Administered 2018-03-08: 190 mL via INTRA_ARTERIAL

## 2018-03-08 MED ORDER — HEPARIN (PORCINE) IN NACL 100-0.45 UNIT/ML-% IJ SOLN: 800.0000 [IU]/h | INTRAMUSCULAR | Status: DC

## 2018-03-08 MED ORDER — AMLODIPINE BESYLATE 5 MG PO TABS
5.0000 mg | ORAL_TABLET | Freq: Every day | ORAL | Status: DC
  Administered 2018-03-08 – 2018-03-09 (×2): 5 mg via ORAL
  Filled 2018-03-08 (×2): qty 1

## 2018-03-08 MED ORDER — FENTANYL CITRATE (PF) 100 MCG/2ML IJ SOLN
INTRAMUSCULAR | Status: DC | PRN
  Administered 2018-03-08: 50 ug via INTRAVENOUS

## 2018-03-08 MED ORDER — NITROGLYCERIN IN D5W 200-5 MCG/ML-% IV SOLN: 2.0000 ug/min | INTRAVENOUS | Status: DC

## 2018-03-08 MED ORDER — LIDOCAINE HCL (PF) 1 % IJ SOLN
INTRAMUSCULAR | Status: DC | PRN
  Administered 2018-03-08: 2 mL via INTRADERMAL

## 2018-03-08 MED ORDER — IOPAMIDOL (ISOVUE-370) INJECTION 76%
INTRAVENOUS | Status: AC
  Filled 2018-03-08: qty 50

## 2018-03-08 MED ORDER — NITROGLYCERIN 1 MG/10 ML FOR IR/CATH LAB
INTRA_ARTERIAL | Status: AC
  Filled 2018-03-08: qty 10

## 2018-03-08 MED ORDER — SODIUM CHLORIDE 0.9 % IV SOLN: INTRAVENOUS | Status: AC

## 2018-03-08 MED ORDER — NITROGLYCERIN 1 MG/10 ML FOR IR/CATH LAB
INTRA_ARTERIAL | Status: DC | PRN
  Administered 2018-03-08: 200 ug via INTRACORONARY

## 2018-03-08 MED ORDER — AMLODIPINE BESYLATE 5 MG PO TABS: 5.0000 mg | ORAL_TABLET | Freq: Every day | ORAL | Status: DC

## 2018-03-08 MED ORDER — ALBUTEROL SULFATE (2.5 MG/3ML) 0.083% IN NEBU: 2.5000 mg | INHALATION_SOLUTION | Freq: Four times a day (QID) | RESPIRATORY_TRACT | Status: DC | PRN

## 2018-03-08 MED ORDER — LOSARTAN POTASSIUM 50 MG PO TABS
50.0000 mg | ORAL_TABLET | Freq: Two times a day (BID) | ORAL | Status: DC
  Administered 2018-03-08 – 2018-03-10 (×5): 50 mg via ORAL
  Filled 2018-03-08 (×5): qty 1

## 2018-03-08 MED ORDER — ALBUTEROL SULFATE (2.5 MG/3ML) 0.083% IN NEBU
5.0000 mg | INHALATION_SOLUTION | Freq: Once | RESPIRATORY_TRACT | Status: AC
  Administered 2018-03-08: 5 mg via RESPIRATORY_TRACT
  Filled 2018-03-08: qty 6

## 2018-03-08 MED ORDER — MORPHINE SULFATE (PF) 4 MG/ML IV SOLN
4.0000 mg | Freq: Once | INTRAVENOUS | Status: AC
Start: 2018-03-08 — End: 2018-03-08
  Administered 2018-03-08: 4 mg via INTRAVENOUS
  Filled 2018-03-08: qty 1

## 2018-03-08 MED ORDER — IOPAMIDOL (ISOVUE-370) INJECTION 76%
INTRAVENOUS | Status: AC
  Filled 2018-03-08: qty 100

## 2018-03-08 MED ORDER — ACETAMINOPHEN 325 MG PO TABS
650.0000 mg | ORAL_TABLET | ORAL | Status: DC | PRN
  Administered 2018-03-09 – 2018-03-10 (×3): 650 mg via ORAL
  Filled 2018-03-08 (×3): qty 2

## 2018-03-08 MED ORDER — SODIUM CHLORIDE 0.9% FLUSH: 3.0000 mL | INTRAVENOUS | Status: DC | PRN

## 2018-03-08 MED ORDER — HYDROCODONE-ACETAMINOPHEN 5-325 MG PO TABS
2.0000 | ORAL_TABLET | ORAL | Status: DC | PRN
  Administered 2018-03-08: 15:00:00 1 via ORAL
  Filled 2018-03-08 (×2): qty 2

## 2018-03-08 MED ORDER — ONDANSETRON HCL 4 MG/2ML IJ SOLN: 4.0000 mg | Freq: Four times a day (QID) | INTRAMUSCULAR | Status: DC | PRN

## 2018-03-08 MED ORDER — ATORVASTATIN CALCIUM 80 MG PO TABS
80.0000 mg | ORAL_TABLET | Freq: Every day | ORAL | Status: DC
  Administered 2018-03-08 – 2018-03-09 (×2): 80 mg via ORAL
  Filled 2018-03-08 (×2): qty 1

## 2018-03-08 MED ORDER — HEPARIN (PORCINE) IN NACL 1000-0.9 UT/500ML-% IV SOLN
INTRAVENOUS | Status: AC
  Filled 2018-03-08: qty 1000

## 2018-03-08 MED ORDER — SODIUM CHLORIDE 0.9 % IV SOLN: 250.0000 mL | INTRAVENOUS | Status: DC | PRN

## 2018-03-08 MED ORDER — CLOPIDOGREL BISULFATE 75 MG PO TABS
75.0000 mg | ORAL_TABLET | Freq: Every day | ORAL | Status: DC
  Administered 2018-03-09 – 2018-03-10 (×2): 75 mg via ORAL
  Filled 2018-03-08 (×2): qty 1

## 2018-03-08 MED ORDER — ACETAMINOPHEN 325 MG PO TABS: 650.0000 mg | ORAL_TABLET | ORAL | Status: DC | PRN

## 2018-03-08 MED ORDER — POTASSIUM CHLORIDE CRYS ER 20 MEQ PO TBCR
40.0000 meq | EXTENDED_RELEASE_TABLET | Freq: Once | ORAL | Status: AC
  Administered 2018-03-08: 40 meq via ORAL
  Filled 2018-03-08: qty 2

## 2018-03-08 MED ORDER — FENTANYL CITRATE (PF) 100 MCG/2ML IJ SOLN
INTRAMUSCULAR | Status: AC
  Filled 2018-03-08: qty 2

## 2018-03-08 MED ORDER — HEPARIN (PORCINE) IN NACL 100-0.45 UNIT/ML-% IJ SOLN
12.0000 [IU]/kg/h | Freq: Once | INTRAMUSCULAR | Status: AC
  Administered 2018-03-08: 12 [IU]/kg/h via INTRAVENOUS
  Filled 2018-03-08: qty 250

## 2018-03-08 MED ORDER — FENTANYL CITRATE (PF) 100 MCG/2ML IJ SOLN
50.0000 ug | Freq: Once | INTRAMUSCULAR | Status: AC
  Administered 2018-03-08: 50 ug via INTRAVENOUS
  Filled 2018-03-08: qty 2

## 2018-03-08 MED ORDER — MIDAZOLAM HCL 2 MG/2ML IJ SOLN
INTRAMUSCULAR | Status: DC | PRN
  Administered 2018-03-08: 1 mg via INTRAVENOUS

## 2018-03-08 MED ORDER — GUAIFENESIN-DM 100-10 MG/5ML PO SYRP
5.0000 mL | ORAL_SOLUTION | ORAL | Status: DC | PRN
  Administered 2018-03-08 – 2018-03-09 (×2): 5 mL via ORAL
  Filled 2018-03-08 (×2): qty 5

## 2018-03-08 MED ORDER — HEART ATTACK BOUNCING BOOK
Freq: Once | Status: AC
  Administered 2018-03-08: 22:00:00
  Filled 2018-03-08: qty 1

## 2018-03-08 MED ORDER — HEPARIN SODIUM (PORCINE) 1000 UNIT/ML IJ SOLN
INTRAMUSCULAR | Status: DC | PRN
  Administered 2018-03-08: 6000 [IU] via INTRAVENOUS
  Administered 2018-03-08: 2000 [IU] via INTRAVENOUS
  Administered 2018-03-08: 4000 [IU] via INTRAVENOUS

## 2018-03-08 MED ORDER — LABETALOL HCL 5 MG/ML IV SOLN: 10.0000 mg | INTRAVENOUS | Status: AC | PRN

## 2018-03-08 MED ORDER — ONDANSETRON HCL 4 MG/2ML IJ SOLN
4.0000 mg | Freq: Four times a day (QID) | INTRAMUSCULAR | Status: DC | PRN
Start: 2018-03-08 — End: 2018-03-10

## 2018-03-08 MED ORDER — ASPIRIN 325 MG PO TABS
325.0000 mg | ORAL_TABLET | Freq: Once | ORAL | Status: AC
  Administered 2018-03-08: 325 mg via ORAL
  Filled 2018-03-08: qty 1

## 2018-03-08 MED ORDER — CLOPIDOGREL BISULFATE 75 MG PO TABS
75.0000 mg | ORAL_TABLET | Freq: Once | ORAL | Status: AC
  Administered 2018-03-08: 75 mg via ORAL

## 2018-03-08 MED ORDER — CLOPIDOGREL BISULFATE 75 MG PO TABS: 75.0000 mg | ORAL_TABLET | Freq: Every day | ORAL | Status: DC

## 2018-03-08 MED ORDER — ANGIOPLASTY BOOK
Freq: Once | Status: AC
  Administered 2018-03-08: 22:00:00
  Filled 2018-03-08: qty 1

## 2018-03-08 MED ORDER — VERAPAMIL HCL 2.5 MG/ML IV SOLN
INTRAVENOUS | Status: AC
  Filled 2018-03-08: qty 2

## 2018-03-08 MED ORDER — CLOPIDOGREL BISULFATE 75 MG PO TABS
ORAL_TABLET | ORAL | Status: AC
  Filled 2018-03-08: qty 3

## 2018-03-08 MED ORDER — CLOPIDOGREL BISULFATE 300 MG PO TABS
ORAL_TABLET | ORAL | Status: DC | PRN
  Administered 2018-03-08: 225 mg via ORAL

## 2018-03-08 MED ORDER — VERAPAMIL HCL 2.5 MG/ML IV SOLN
INTRAVENOUS | Status: DC | PRN
  Administered 2018-03-08: 10 mL via INTRA_ARTERIAL

## 2018-03-08 SURGICAL SUPPLY — 22 items
BALLN SAPPHIRE 2.5X12 (BALLOONS) ×2
BALLOON SAPPHIRE 2.5X12 (BALLOONS) IMPLANT
CATH INFINITI 5 FR JL3.5 (CATHETERS) ×1 IMPLANT
CATH INFINITI 5FR JL4 (CATHETERS) ×1 IMPLANT
CATH INFINITI JR4 5F (CATHETERS) ×1 IMPLANT
CATH VISTA GUIDE 6FR XB3.5 (CATHETERS) ×1 IMPLANT
CATH VISTA GUIDE 6FR XB4 (CATHETERS) ×1 IMPLANT
CATH VISTA GUIDE 6FR XB4.5 (CATHETERS) ×1 IMPLANT
COVER PRB 48X5XTLSCP FOLD TPE (BAG) IMPLANT
COVER PROBE 5X48 (BAG) ×2
DEVICE RAD COMP TR BAND LRG (VASCULAR PRODUCTS) ×1 IMPLANT
GLIDESHEATH SLEND A-KIT 6F 22G (SHEATH) ×1 IMPLANT
GUIDEWIRE INQWIRE 1.5J.035X260 (WIRE) IMPLANT
INQWIRE 1.5J .035X260CM (WIRE) ×2
KIT ENCORE 26 ADVANTAGE (KITS) ×1 IMPLANT
KIT HEART LEFT (KITS) ×2 IMPLANT
KIT HEMO VALVE WATCHDOG (MISCELLANEOUS) ×1 IMPLANT
PACK CARDIAC CATHETERIZATION (CUSTOM PROCEDURE TRAY) ×2 IMPLANT
STENT SYNERGY DES 3X24 (Permanent Stent) ×1 IMPLANT
TRANSDUCER W/STOPCOCK (MISCELLANEOUS) ×2 IMPLANT
TUBING CIL FLEX 10 FLL-RA (TUBING) ×2 IMPLANT
WIRE ASAHI PROWATER 180CM (WIRE) ×1 IMPLANT

## 2018-03-08 NOTE — ED Notes (Signed)
CRITICAL VALUE ALERT  Critical Value:  Troponin 0.13  Date & Time Notied:  03/08/2018 655  Provider Notified: [email or web address]  Orders Received/Actions taken: md notified

## 2018-03-08 NOTE — Interval H&P Note (Signed)
Cath Lab Visit (complete for each Cath Lab visit)  Clinical Evaluation Leading to the Procedure:   ACS: Yes.    Non-ACS:    Anginal Classification: CCS IV  Anti-ischemic medical therapy: Minimal Therapy (1 class of medications)  Non-Invasive Test Results: No non-invasive testing performed  Prior CABG: No previous CABG      History and Physical Interval Note:  03/08/2018 9:29 AM  [name]  has presented today for surgery, with the diagnosis of NSTEMI  The various methods of treatment have been discussed with the patient and family. After consideration of risks, benefits and other options for treatment, the patient has consented to  Procedure(s): LEFT HEART CATH AND CORONARY ANGIOGRAPHY (N/A) as a surgical intervention .  The patient's history has been reviewed, patient examined, no change in status, stable for surgery.  I have reviewed the patient's chart and labs.  Questions were answered to the patient's satisfaction.     Belva Crome III

## 2018-03-08 NOTE — ED Provider Notes (Signed)
[facility] EMERGENCY DEPARTMENT Provider Note   CSN: [id number] Arrival date & time: 03/08/18  7619  Time seen 03:44 AM   History   Chief Complaint Chief Complaint  Patient presents with  . Chest Pain    HPI [name] is a 67 y.o. male.  HPI patient has a history of coronary artery disease.  He is followed by Dr. Lovena Le.  He had 2 stents placed in 2013.  He states tonight he was awakened with chest pain and points to the center of his chest about 1 hour ago that feels just like the pain he had when he had his heart attack.  He states this is the first episode of chest pain he has had since he had his heart attack.  He describes as pressure.  He states the pain does not radiate.  It does make him feel short of breath and he has had nausea without vomiting.  He reports waking up diaphoretic.  He did take 2 nitroglycerin sublingual at home without relief.  Review of his chart shows he had an acute inferior MI in 2013 with stent placement in the obtuse marginal  Patient also has had a cough for the past 2 weeks and has been wheezing.  He does not have an inhaler to use.  He states he feels like he cannot breathe in deep to take a big deep breath.  Patient continues to smoke.  PCP Sinda Du, MD Cardiology Dr Lovena Le  Past Medical History:  Diagnosis Date  . Acute MI ([place])   . CAD (coronary artery disease)    a. 08/2012 Inferolateral STEMI/Cath/PCI: LM nl, LAD minor irregs, RI 20p, LCX nl, OM1 100d (Tx w 2.75x16mm Promus DES), RCA nondom, minor irregs, EF60-65%.  . Environmental allergies   . Hypertension   . Tobacco abuse     Patient Active Problem List   Diagnosis Date Noted  . Dyslipidemia 11/18/2014  . CAD (coronary artery disease) 08/28/2012  . Hypertensive heart disease 08/28/2012  . Tobacco abuse 08/28/2012  . Myocardial infarction, inferior, acute, initial episode ([place]) 08/26/2012    Past Surgical History:  Procedure Laterality Date  . APPENDECTOMY    .  CHOLECYSTECTOMY    . LEFT HEART CATHETERIZATION WITH CORONARY ANGIOGRAM N/A 08/25/2012   Procedure: LEFT HEART CATHETERIZATION WITH CORONARY ANGIOGRAM;  Surgeon: Hillary Bow, MD;  Location: [facility] CATH LAB;  Service: Cardiovascular;  Laterality: N/A;  . PERCUTANEOUS CORONARY STENT INTERVENTION (PCI-S) Left 08/25/2012   Procedure: PERCUTANEOUS CORONARY STENT INTERVENTION (PCI-S);  Surgeon: Hillary Bow, MD;  Location: [facility] CATH LAB;  Service: Cardiovascular;  Laterality: Left;  stent x 1 om        Home Medications    Prior to Admission medications   Medication Sig Start Date End Date Taking? Authorizing Provider  albuterol (PROVENTIL HFA;VENTOLIN HFA) 108 (90 Base) MCG/ACT inhaler Inhale 2 puffs into the lungs every 6 (six) hours as needed for wheezing or shortness of breath.    [provider]  amLODipine (NORVASC) 5 MG tablet Take 1 tablet (5 mg total) by mouth daily. 11/01/17   Evans Lance, MD  aspirin EC 81 MG EC tablet Take 1 tablet (81 mg total) by mouth daily. Patient taking differently: Take 81 mg by mouth at bedtime.  08/28/12   Theora Gianotti, NP  carvedilol (COREG) 25 MG tablet TAKE ONE AND ONE-HALF TABLETS BY MOUTH TWO TIMES DAILY 11/07/17   Evans Lance, MD  cloNIDine (CATAPRES) 0.1 MG  tablet TAKE 0.1 MG ( ONE TABLET) IN THE MORNING AND TAKE 0.2 MG ( 2 TABLETS) IN THE EVENING 02/02/18   Evans Lance, MD  clopidogrel (PLAVIX) 75 MG tablet TAKE ONE (1) TABLET BY MOUTH EVERY DAY 07/14/17   Evans Lance, MD  HYDROcodone-acetaminophen (NORCO/VICODIN) 5-325 MG tablet Take 2 tablets by mouth every 4 (four) hours as needed. 01/24/16   Daleen Bo, MD  losartan (COZAAR) 50 MG tablet TAKE ONE TABLET BY MOUTH TWICE A DAY 03/06/18   Evans Lance, MD  nitroGLYCERIN (NITROSTAT) 0.4 MG SL tablet PLACE ONE TABLET UNDER THE TONGUE EVERY FIVE MINUTES FOR THREE DOSES AS NEEDED FOR CHEST PAIN 06/02/16   Imogene Burn, PA-C    Family History Family History    Problem Relation Age of Onset  . Hypertension Mother   . Hypertension Father   . Stroke Father     Social History Social History   Tobacco Use  . Smoking status: Current Every Day Smoker    Packs/day: 0.30  . Smokeless tobacco: Never Used  . Tobacco comment: Patient is still smoking everday but he is trying to quit.   Substance Use Topics  . Alcohol use: No    Alcohol/week: 0.0 oz  . Drug use: No     Allergies   Sulfa antibiotics   Review of Systems Review of Systems  All other systems reviewed and are negative.    Physical Exam Updated Vital Signs BP (!) 162/102   Pulse (!) 43   Resp 13   Ht 5\' 7"  (1.702 m)   Wt 66.2 kg (146 lb)   SpO2 97%   BMI 22.87 kg/m   Vital signs normal except for hypertension and bradycardia   Physical Exam  Constitutional: He is oriented to person, place, and time. He appears well-developed and well-nourished.  Non-toxic appearance. He does not appear ill. He appears distressed.  HENT:  Head: Normocephalic and atraumatic.  Right Ear: External ear normal.  Left Ear: External ear normal.  Nose: Nose normal. No mucosal edema or rhinorrhea.  Mouth/Throat: Oropharynx is clear and moist and mucous membranes are normal. No dental abscesses or uvula swelling.  Eyes: Pupils are equal, round, and reactive to light. Conjunctivae and EOM are normal.  Neck: Normal range of motion and full passive range of motion without pain. Neck supple.  Cardiovascular: Normal rate, regular rhythm and normal heart sounds. Exam reveals no gallop and no friction rub.  No murmur heard. Pulmonary/Chest: Effort normal. No respiratory distress. He has decreased breath sounds. He has wheezes. He has no rhonchi. He has no rales. He exhibits no tenderness and no crepitus.  Patient is noted to have diffuse wheezing sometimes audible    Abdominal: Soft. Normal appearance and bowel sounds are normal. He exhibits no distension. There is no tenderness. There is no  rebound and no guarding.  Musculoskeletal: Normal range of motion. He exhibits no edema or tenderness.  Moves all extremities well.   Neurological: He is alert and oriented to person, place, and time. He has normal strength. No cranial nerve deficit.  Skin: Skin is warm, dry and intact. No rash noted. No erythema. No pallor.  Psychiatric: His mood appears anxious. His speech is delayed. He is slowed.  Nursing note and vitals reviewed.    ED Treatments / Results  Labs (all labs ordered are listed, but only abnormal results are displayed) Results for orders placed or performed during the hospital encounter of 03/08/18  Comprehensive metabolic  panel  Result Value Ref Range   Sodium 143 135 - 145 mmol/L   Potassium 3.0 (L) 3.5 - 5.1 mmol/L   Chloride 109 101 - 111 mmol/L   CO2 24 22 - 32 mmol/L   Glucose, Bld 115 (H) 65 - 99 mg/dL   BUN 13 6 - 20 mg/dL   Creatinine, Ser 1.10 0.61 - 1.24 mg/dL   Calcium 8.7 (L) 8.9 - 10.3 mg/dL   Total Protein 7.1 6.5 - 8.1 g/dL   Albumin 3.6 3.5 - 5.0 g/dL   AST 18 15 - 41 U/L   ALT 18 17 - 63 U/L   Alkaline Phosphatase 104 38 - 126 U/L   Total Bilirubin 0.6 0.3 - 1.2 mg/dL   GFR calc non Af Amer >60 >60 mL/min   GFR calc Af Amer >60 >60 mL/min   Anion gap 10 5 - 15  Troponin I  Result Value Ref Range   Troponin I <0.03 <0.03 ng/mL  Brain natriuretic peptide  Result Value Ref Range   B Natriuretic Peptide 65.0 0.0 - 100.0 pg/mL  CBC with Differential  Result Value Ref Range   WBC 9.1 4.0 - 10.5 K/uL   RBC 4.35 4.22 - 5.81 MIL/uL   Hemoglobin 13.3 13.0 - 17.0 g/dL   HCT 41.1 39.0 - 52.0 %   MCV 94.5 78.0 - 100.0 fL   MCH 30.6 26.0 - 34.0 pg   MCHC 32.4 30.0 - 36.0 g/dL   RDW 13.2 11.5 - 15.5 %   Platelets 229 150 - 400 K/uL   Neutrophils Relative % 61 %   Neutro Abs 5.5 1.7 - 7.7 K/uL   Lymphocytes Relative 26 %   Lymphs Abs 2.4 0.7 - 4.0 K/uL   Monocytes Relative 8 %   Monocytes Absolute 0.8 0.1 - 1.0 K/uL   Eosinophils Relative 4  %   Eosinophils Absolute 0.4 0.0 - 0.7 K/uL   Basophils Relative 1 %   Basophils Absolute 0.1 0.0 - 0.1 K/uL  Troponin I  Result Value Ref Range   Troponin I 0.13 (HH) <0.03 ng/mL  I-stat troponin, ED  Result Value Ref Range   Troponin i, poc 0.01 0.00 - 0.08 ng/mL   Comment 3           Laboratory interpretation all normal except + delta troponin    EKG EKG Interpretation  Date/Time:  Wednesday March 08 2018 03:40:23 EDT Ventricular Rate:  45 PR Interval:    QRS Duration: 130 QT Interval:  509 QTC Calculation: 441 R Axis:   -58 Text Interpretation:  Sinus bradycardia Nonspecific IVCD with LAD LVH with secondary repolarization abnormality Anterior Q waves, possibly due to LVH No significant change since last tracing 25 Mar 2016 Confirmed by Rolland Porter [phone]) on 03/08/2018 3:44:00 AM   Radiology Dg Chest Port 1 View  Result Date: 03/08/2018 CLINICAL DATA:  Mid chest pain, nausea.  Smoker. EXAM: PORTABLE CHEST 1 VIEW COMPARISON:  Chest radiograph January 24, 2016 FINDINGS: The heart size and mediastinal contours are within normal limits. Both lungs are clear. Mild hyperinflation. The visualized skeletal structures are nonsuspicious. RIGHT antecubital intravenous catheter. IMPRESSION: Mild hyperinflation without focal consolidation. Electronically Signed   By: Elon Alas M.D.   On: 03/08/2018 04:18     Cardiac Cath Aug 25, 2012 Impression: 1. Acute inferolateral MI secondary to occluded first OM branch distally 2. Successful PTCA/DES x 1 OM1 3. Mild non-obstructive disease LAD.  4. Preserved LV systolic function  Procedures .  Critical Care Performed by: Rolland Porter, MD Authorized by: Rolland Porter, MD   Critical care provider statement:    Critical care time (minutes):  40   Critical care was necessary to treat or prevent imminent or life-threatening deterioration of the following conditions:  Cardiac failure and respiratory failure   Critical care was time spent  personally by me on the following activities:  Discussions with consultants, evaluation of patient's response to treatment, examination of patient, obtaining history from patient or surrogate, ordering and review of laboratory studies, ordering and review of radiographic studies, pulse oximetry, re-evaluation of patient's condition and review of old charts   (including critical care time)  Medications Ordered in ED Medications  morphine 4 MG/ML injection 4 mg (has no administration in time range)  aspirin tablet 325 mg (325 mg Oral Given 03/08/18 0403)  nitroGLYCERIN 50 mg in dextrose 5 % 250 mL (0.2 mg/mL) infusion (10 mcg/min Intravenous Rate/Dose Change 03/08/18 0443)  fentaNYL (SUBLIMAZE) injection 50 mcg (50 mcg Intravenous Given 03/08/18 0403)  albuterol (PROVENTIL) (2.5 MG/3ML) 0.083% nebulizer solution 5 mg (5 mg Nebulization Given 03/08/18 0451)  heparin ADULT infusion 100 units/mL (25000 units/226mL sodium chloride 0.45%) (12 Units/kg/hr  66.2 kg Intravenous New Bag/Given 03/08/18 0512)  potassium chloride SA (K-DUR,KLOR-CON) CR tablet 40 mEq (40 mEq Oral Given 03/08/18 0545)     Initial Impression / Assessment and Plan / ED Course  I have reviewed the triage vital signs and the nursing notes.  Pertinent labs & imaging results that were available during my care of the patient were reviewed by me and considered in my medical decision making (see chart for details).   Pt was given oral aspirin 325 mg to chew and started on a NTG Drip and given IV fentanyl for pain.    Patient is noted to have wheezing and has had a cough for couple weeks.  I would normally have given him an albuterol inhaler however he states the chest pain is having now is just like the chest pain he had when he had his MI.  His EKG although abnormal is unchanged from an EKG a year ago.  His STEMI was in 2013.  Patient was just seen by his cardiologist in March and at that time denied any angina.  He states this is the  first episode of chest pain he has had since his MI.  He was started on a nitroglycerin drip and was given fentanyl for pain.  Patient's initial troponin is negative which would be expected because he is only had pain for about 1 to 1/2 hours.  I am going to talk to the cardiologist about him.  Patient's chest x-ray is consistent with a COPD type flare with hyperinflation, no congestive heart failure. He is wheezing and needs an albuterol nebulizer.   04:45 AM Dr Wille Glaser, Cardiology, has looked at his EKG and agrees it is unchanged from his prior in 2017. We discussed putting the patient on heparin, getting a delta troponin, and hopefully he can stay here and see the cardiologist here, however if his condition changes or the second troponin is positive I will call him back.  He states that he feels the albuterol nebulizer will be okay.  When I reviewed patient's blood work he is noted to have a low potassium, he was given potassium supplementation orally.  05:20 AM pt has had his albuterol nebulizer, his lungs are now clear and he is breathing easier. His chest pain is much improved but  still present. He continues on a NTG drip and heparin drip.   6:55 AM patient's delta troponin and now positive.  I will talk to cardiology [facility], he may need to be transferred there this morning.  He was given more pain medication.  07:16 AM Dr Martinique, cardiology accepts in transfer to [facility], wants admitted to Step down. [facility] states 8 patients are in line for Stepdown. Dr Martinique states okay to send to the ED so they can see patient sooner since he is having ongoing pain and will need to go to cath lab. [facility] also states they do not have a truck available to transport this patient for awhile. Magda Paganini, charge nurse at [facility] ED made aware of transfer and need to contact EMS. EMS was contacted to transport patient.  07:24 AM Norva Pavlov, Charge Nurse at [facility] ED made aware of probable transfer to the ED since no  beds available.   07:26 AM Dr Hillard Danker, ED at [facility] made aware of transfer.  07:28 AM patient and his family made aware of his + delta troponin and need to transfer to [facility] by EMS.   Cath lab called and patient is going straight to cath lab on arrival at [facility].  Final Clinical Impressions(s) / ED Diagnoses   Final diagnoses:  Hypokalemia  NSTEMI (non-ST elevated myocardial infarction) ([place])  Bronchitis with bronchospasm    Plan admission at [facility], MD, [place] Small, Daleen Bo, MD 03/08/18 [phone]

## 2018-03-08 NOTE — Progress Notes (Signed)
TR BAND REMOVAL  LOCATION:  right radial  DEFLATED PER PROTOCOL:  Yes.    TIME BAND OFF / DRESSING APPLIED:   1515   SITE UPON ARRIVAL:   Level 0  SITE AFTER BAND REMOVAL:  Level 0  CIRCULATION SENSATION AND MOVEMENT:  Within Normal Limits  Yes.    COMMENTS:

## 2018-03-08 NOTE — H&P (Signed)
History & Physical    Patient ID: [name] MRN: [id number], DOB/AGE: 20-Dec-1950   Admit date: 03/08/2018  Primary Care Provider: Sinda Du, MD Primary Cardiologist: Cristopher Peru, MD   Chief Complaint: Chest Pain  Patient Profile    [name] is a 67 y.o. male with past medical history of CAD (s/p STEMI in 08/2012 with DES to [place] in 08/2012), HTN, HLD, and tobacco use who presented to [facility] ED this morning for evaluation of chest pain.   History of Present Illness    [name] was recently evaluated by Dr. Lovena Le in 01/2018 and denied any recent chest pain or dyspnea on exertion at that time.  Was noted to be hypertensive and Clonidine was further titrated for improved BP control.  In talking with the patient today, he reports being in his usual state of health until early this morning when he was awoken from sleep due to centralized chest discomfort. He describes this as a pressure along his left pectoral region and notes associated nausea.  He took 2 SL NTG while at home without any improvement in his symptoms and came to the ED for further evaluation.   He reports still having chest discomfort at this time. EMS is at the bedside during this encounter to assist with transfer to [facility].   Initial labs show WBC 9.1, Hgb 13.3, platelets 229, Na+ 143, K+ 3.0, and creatinine 1.10. BNP 65.  Initial troponin negative with repeat value at 0.13.  EKG shows sinus bradycardia, heart rate 45, and diffuse T-wave inversion in the setting of LVH (similar to prior tracings). CXR shows mild hyperinflation without focal consolidation.   Past Medical History:  Diagnosis Date  . Acute MI ([place])   . CAD (coronary artery disease)    a. 08/2012 Inferolateral STEMI/Cath/PCI: LM nl, LAD minor irregs, RI 20p, LCX nl, OM1 100d (Tx w 2.75x79mm Promus DES), RCA nondom, minor irregs, EF60-65%.  . Environmental allergies   . Hypertension   . Tobacco abuse     Past Surgical History:    Procedure Laterality Date  . APPENDECTOMY    . CHOLECYSTECTOMY    . LEFT HEART CATHETERIZATION WITH CORONARY ANGIOGRAM N/A 08/25/2012   Procedure: LEFT HEART CATHETERIZATION WITH CORONARY ANGIOGRAM;  Surgeon: Hillary Bow, MD;  Location: [facility] CATH LAB;  Service: Cardiovascular;  Laterality: N/A;  . PERCUTANEOUS CORONARY STENT INTERVENTION (PCI-S) Left 08/25/2012   Procedure: PERCUTANEOUS CORONARY STENT INTERVENTION (PCI-S);  Surgeon: Hillary Bow, MD;  Location: [facility] CATH LAB;  Service: Cardiovascular;  Laterality: Left;  stent x 1 om     Medications Prior to Admission: Prior to Admission medications   Medication Sig Start Date End Date Taking? Authorizing Provider  albuterol (PROVENTIL HFA;VENTOLIN HFA) 108 (90 Base) MCG/ACT inhaler Inhale 2 puffs into the lungs every 6 (six) hours as needed for wheezing or shortness of breath.    [provider]  amLODipine (NORVASC) 5 MG tablet Take 1 tablet (5 mg total) by mouth daily. 11/01/17   Evans Lance, MD  aspirin EC 81 MG EC tablet Take 1 tablet (81 mg total) by mouth daily. Patient taking differently: Take 81 mg by mouth at bedtime.  08/28/12   Theora Gianotti, NP  carvedilol (COREG) 25 MG tablet TAKE ONE AND ONE-HALF TABLETS BY MOUTH TWO TIMES DAILY 11/07/17   Evans Lance, MD  cloNIDine (CATAPRES) 0.1 MG tablet TAKE 0.1 MG ( ONE TABLET) IN THE MORNING AND TAKE  0.2 MG ( 2 TABLETS) IN THE EVENING 02/02/18   Evans Lance, MD  clopidogrel (PLAVIX) 75 MG tablet TAKE ONE (1) TABLET BY MOUTH EVERY DAY 07/14/17   Evans Lance, MD  HYDROcodone-acetaminophen (NORCO/VICODIN) 5-325 MG tablet Take 2 tablets by mouth every 4 (four) hours as needed. 01/24/16   Daleen Bo, MD  losartan (COZAAR) 50 MG tablet TAKE ONE TABLET BY MOUTH TWICE A DAY 03/06/18   Evans Lance, MD  nitroGLYCERIN (NITROSTAT) 0.4 MG SL tablet PLACE ONE TABLET UNDER THE TONGUE EVERY FIVE MINUTES FOR THREE DOSES AS NEEDED FOR CHEST PAIN 06/02/16   Imogene Burn, PA-C     Allergies:    Allergies  Allergen Reactions  . Sulfa Antibiotics Hives    Social History:   Social History   Socioeconomic History  . Marital status: Married    Spouse name: Not on file  . Number of children: Not on file  . Years of education: Not on file  . Highest education level: Not on file  Occupational History  . Not on file  Social Needs  . Financial resource strain: Not on file  . Food insecurity:    Worry: Not on file    Inability: Not on file  . Transportation needs:    Medical: Not on file    Non-medical: Not on file  Tobacco Use  . Smoking status: Current Every Day Smoker    Packs/day: 0.30  . Smokeless tobacco: Never Used  . Tobacco comment: Patient is still smoking everday but he is trying to quit.   Substance and Sexual Activity  . Alcohol use: No    Alcohol/week: 0.0 oz  . Drug use: No  . Sexual activity: Not on file  Lifestyle  . Physical activity:    Days per week: Not on file    Minutes per session: Not on file  . Stress: Not on file  Relationships  . Social connections:    Talks on phone: Not on file    Gets together: Not on file    Attends religious service: Not on file    Active member of club or organization: Not on file    Attends meetings of clubs or organizations: Not on file    Relationship status: Not on file  . Intimate partner violence:    Fear of current or ex partner: Not on file    Emotionally abused: Not on file    Physically abused: Not on file    Forced sexual activity: Not on file  Other Topics Concern  . Not on file  Social History Narrative  . Not on file      Family History:  The patient's family history includes Hypertension in his father and mother; Stroke in his father.     Review of Systems    General:  No chills, fever, night sweats or weight changes.  Cardiovascular:  No dyspnea on exertion, edema, orthopnea, palpitations, paroxysmal nocturnal dyspnea. Positive for chest pain.    Dermatological: No rash, lesions/masses Respiratory: No cough, dyspnea Urologic: No hematuria, dysuria Abdominal:   No vomiting, diarrhea, bright red blood per rectum, melena, or hematemesis. Positive for nausea.  Neurologic:  No visual changes, wkns, changes in mental status. All other systems reviewed and are otherwise negative except as noted above.  Physical Exam    Vitals:   03/08/18 0715 03/08/18 0720 03/08/18 0725 03/08/18 0730  BP: (!) 142/99 (!) 151/100 (!) 157/108 (!) 167/105  Pulse: (!) 50 Marland Kitchen)  51 (!) 56 (!) 56  Resp: 12 13 11 13   SpO2: 97% 97% 98% 98%  Weight:      Height:       No intake or output data in the 24 hours ending 03/08/18 0813 Filed Weights   03/08/18 0338  Weight: 146 lb (66.2 kg)   Body mass index is 22.87 kg/m.   General: Well developed, well nourished African American male appearing in no acute distress. Head: Normocephalic, atraumatic, sclera non-icteric, no xanthomas, nares are without discharge. Dentition:  Neck: No carotid bruits. JVD not elevated.  Lungs: Respirations regular and unlabored, without wheezes or rales.  Heart: Regular rhythm, bradycardiac rate. No S3 or S4.  No murmur, no rubs, or gallops appreciated. Abdomen: Soft, non-tender, non-distended with normoactive bowel sounds. No hepatomegaly. No rebound/guarding. No obvious abdominal masses. Msk:  Strength and tone appear normal for age. No joint deformities or effusions. Extremities: No clubbing or cyanosis. No edema.  Distal pedal pulses are 2+ bilaterally. Neuro: Alert and oriented X 3. Moves all extremities spontaneously. No focal deficits noted. Psych:  Responds to questions appropriately with a normal affect. Skin: No rashes or lesions noted  Labs and Radiology Studies    EKG:  The ECG that was done was personally reviewed and demonstrates sinus bradycardia, heart rate 45, and diffuse T-wave inversion in the setting of LVH (similar to prior tracings).   Relevant CV  Studies:  Cardiac Catheterization: 08/2012 Hemodynamic Findings: Central aortic pressure: 103/76 Left ventricular pressure: 114/4/11  Angiographic Findings:  Left main:  No obstructive disease.   Left Anterior Descending Artery: Large caliber vessel that courses to the apex. There are mild luminal irregularities in the mid vessel. The diagonal branch is moderate sized and has no obstructive disease.   [place] Intermediate: Large caliber vessel with serial 20% stenoses proximal and mid vessel.   Circumflex Artery:  Large caliber vessel that terminates into a moderate sized obtuse marginal branch. The distal portion of the marginal branch is completely occluded.   Right Coronary Artery: Small, non-dominant vessel with mild plaque.   Left Ventricular Angiogram: LVEF=60-65%.  Impression: 1. Acute inferolateral MI secondary to occluded first OM branch distally 2. Successful PTCA/DES x 1 OM1 3. Mild non-obstructive disease LAD.  4. Preserved LV systolic function  Recommendations: Will continue ASA/Effient. Will start beta blocker as BP tolerates. Will start statin. CCU. Echo before discharge  Laboratory Data:  Chemistry Recent Labs  Lab 03/08/18 0351  NA 143  K 3.0*  CL 109  CO2 24  GLUCOSE 115*  BUN 13  CREATININE 1.10  CALCIUM 8.7*  GFRNONAA >60  GFRAA >60  ANIONGAP 10    Recent Labs  Lab 03/08/18 0351  PROT 7.1  ALBUMIN 3.6  AST 18  ALT 18  ALKPHOS 104  BILITOT 0.6   Hematology Recent Labs  Lab 03/08/18 0351  WBC 9.1  RBC 4.35  HGB 13.3  HCT 41.1  MCV 94.5  MCH 30.6  MCHC 32.4  RDW 13.2  PLT 229   Cardiac Enzymes Recent Labs  Lab 03/08/18 0351 03/08/18 0612  TROPONINI <0.03 0.13*    Recent Labs  Lab 03/08/18 0347  TROPIPOC 0.01    BNP Recent Labs  Lab 03/08/18 0352  BNP 65.0    DDimer No results for input(s): DDIMER in the last 168 hours.  Radiology/Studies:  Dg Chest Port 1 View  Result Date: 03/08/2018 CLINICAL DATA:   Mid chest pain, nausea.  Smoker. EXAM: PORTABLE CHEST 1 VIEW COMPARISON:  Chest radiograph January 24, 2016 FINDINGS: The heart size and mediastinal contours are within normal limits. Both lungs are clear. Mild hyperinflation. The visualized skeletal structures are nonsuspicious. RIGHT antecubital intravenous catheter. IMPRESSION: Mild hyperinflation without focal consolidation. Electronically Signed   By: Elon Alas M.D.   On: 03/08/2018 04:18    Assessment and Plan:   1. NSTEMI - patient presents for evaluation of new-onset chest pain which awoke him from sleep. Reports associated nausea and symptoms did not resolve with 2 SL NTG. - Initial troponin negative with repeat value at 0.13. EKG shows sinus bradycardia, heart rate 45, and diffuse T-wave inversion in the setting of LVH (similar to prior tracings).  - In the setting of known CAD, his presenting symptoms, and elevated cardiac enzymes would anticipate definitive evaluation with a cardiac catheterization. The emergency department MD has already been in touch with [facility] to arrange for ED to ED transfer. I was contacted by the Cath Lab coordinator and he will go directly to the catheterization lab where he will be evaluated by an MD prior to the procedure. Patient is in agreement for transfer and planned cardiac catheterization. Family updated at the bedside.  - He has been started on Heparin. Continue ASA and Plavix. BB held due to bradycardia.   2. CAD - s/p STEMI in 08/2012 with DES to [place] in 08/2012 - on Plavix and BB therapy PTA. BB held in the setting of bradycardia. Unclear why he is not on statin therapy. Will initiate Atorvastatin 80mg  daily and recheck FLP.   3. HTN - BP variable at 113/81-160 7/108 while in the ED.  On Amlodipine, Carvedilol, Losartan, and Clonidine prior to admission. Restart as appropriate following catheterization.   4. Bradycardia - Heart rate currently in the mid 40's to 50's. He was on Coreg 37.5 mg  twice daily prior to admission.  Will hold for now and restart at a lower dose.  5. Hypokalemia - K+ 3.0. Given 40 mEq while in the ED.  - repeat BMET in AM.   6. Tobacco Use - He continues to smoke 2 to 5 packs daily per his daughter's report. Smoking cessation will need to be reinforced throughout admission.    For questions or updates, please contact [place] Please consult [email or web address] for contact info under Cardiology/STEMI.   Signed, Erma Heritage, PA-C 03/08/2018, 8:13 AM Pager: [phone]

## 2018-03-08 NOTE — H&P (View-Only) (Signed)
Pt received from [facility] via EMS alert and orient X4,  Skin warm and dry, denies any CP at this time.  Pt placed on bedside monitor, NS started with  IV Heparin and Nitro presently infusing in right forearm.  Consent signed for procedure and Dr Ellyn Hack in to talk to pt.

## 2018-03-08 NOTE — ED Triage Notes (Signed)
Pt c/o central chest pain with nausea. Pt states he took nitro at home with no relief.

## 2018-03-08 NOTE — Progress Notes (Signed)
Pt received from [facility] via EMS alert and orient X4,  Skin warm and dry, denies any CP at this time.  Pt placed on bedside monitor, NS started with  IV Heparin and Nitro presently infusing in right forearm.  Consent signed for procedure and Dr Ellyn Hack in to talk to pt.

## 2018-03-09 LAB — CBC
HEMATOCRIT: 37.7 % — AB (ref 39.0–52.0)
HEMOGLOBIN: 12.7 g/dL — AB (ref 13.0–17.0)
MCH: 31 pg (ref 26.0–34.0)
MCHC: 33.7 g/dL (ref 30.0–36.0)
MCV: 92 fL (ref 78.0–100.0)
Platelets: 179 10*3/uL (ref 150–400)
RBC: 4.1 MIL/uL — ABNORMAL LOW (ref 4.22–5.81)
RDW: 13.3 % (ref 11.5–15.5)
WBC: 7.8 10*3/uL (ref 4.0–10.5)

## 2018-03-09 LAB — BASIC METABOLIC PANEL
ANION GAP: 6 (ref 5–15)
BUN: 9 mg/dL (ref 6–20)
CHLORIDE: 110 mmol/L (ref 101–111)
CO2: 25 mmol/L (ref 22–32)
Calcium: 9 mg/dL (ref 8.9–10.3)
Creatinine, Ser: 1.04 mg/dL (ref 0.61–1.24)
GFR calc Af Amer: >60 mL/min (ref >60)
GLUCOSE: 98 mg/dL (ref 65–99)
POTASSIUM: 3.6 mmol/L (ref 3.5–5.1)
Sodium: 141 mmol/L (ref 135–145)

## 2018-03-09 LAB — LIPID PANEL
Cholesterol: 131 mg/dL (ref 0–200)
HDL: 32 mg/dL — ABNORMAL LOW (ref >40)
LDL CALC: 78 mg/dL (ref 0–99)
TRIGLYCERIDES: 104 mg/dL (ref <150)
Total CHOL/HDL Ratio: 4.1 RATIO
VLDL: 21 mg/dL (ref 0–40)

## 2018-03-09 LAB — TROPONIN I: Troponin I: 6.08 ng/mL (ref <0.03)

## 2018-03-09 MED ORDER — GUAIFENESIN ER 600 MG PO TB12
600.0000 mg | ORAL_TABLET | Freq: Two times a day (BID) | ORAL | Status: DC | PRN
  Administered 2018-03-09: 600 mg via ORAL
  Filled 2018-03-09 (×3): qty 1

## 2018-03-09 MED ORDER — AMLODIPINE BESYLATE 10 MG PO TABS
10.0000 mg | ORAL_TABLET | Freq: Every day | ORAL | Status: DC
  Administered 2018-03-10: 10 mg via ORAL
  Filled 2018-03-09 (×2): qty 1

## 2018-03-09 MED ORDER — CARVEDILOL 3.125 MG PO TABS: 3.1250 mg | ORAL_TABLET | Freq: Two times a day (BID) | ORAL | Status: DC

## 2018-03-09 MED ORDER — AMLODIPINE BESYLATE 5 MG PO TABS
5.0000 mg | ORAL_TABLET | Freq: Once | ORAL | Status: AC
  Administered 2018-03-09: 5 mg via ORAL
  Filled 2018-03-09: qty 1

## 2018-03-09 NOTE — Progress Notes (Signed)
CARDIAC REHAB PHASE I   PRE:  Rate/Rhythm: 50 SB  BP:  Supine: 172/102,   206/129  Sitting:   Standing:    SaO2: 99%RA  MODE:  Ambulation:  bathroom          ft   POST:  Rate/Rhythm: 51 SB  BP:  Supine: 210/117  Sitting:   Standing:    SaO2:  0935-1037 Only walked pt to bathroom and back to bed due to high BP. Dr Claiborne Billings in and pt not for discharge.MI education completed with pt and family member who voiced understanding. Reviewed importance of plavix with stent. Reviewed NTG use, MI restrictions, heart healthy food choices, CRP 2. Will refer to [facility] program but pt stated he wants to walk on his own. Will go over  Ex ed tomorrow when pt can ambulate. Smoking cessation done by Dr Claiborne Billings and smoking cessation reinforced and  handout given and fake cigarette. Pt has quit in the past cold [place].   Timothy Good, RN BSN  03/09/2018 10:33 AM

## 2018-03-09 NOTE — Progress Notes (Signed)
Pt's B/P=163/100;MD on call Bhagat was called & made aware & Norvasc 5 mg po given early instead of 10 am as scheduled.

## 2018-03-09 NOTE — Progress Notes (Addendum)
Progress Note  Patient Name: [name] Date of Encounter: 03/09/2018  Primary Cardiologist: Cristopher Peru, MD   Subjective   No chest pain but feels "lousy". Tired. Some occasional dizziness. No dyspnea. Right radial cath site is stable.   Inpatient Medications    Scheduled Meds: . amLODipine  5 mg Oral Daily  . aspirin EC  81 mg Oral Daily  . atorvastatin  80 mg Oral q1800  . clopidogrel  75 mg Oral Q breakfast  . hydrochlorothiazide  12.5 mg Oral Daily  . losartan  50 mg Oral BID  . sodium chloride flush  3 mL Intravenous Q12H   Continuous Infusions: . sodium chloride    . nitroGLYCERIN Stopped (03/08/18 1515)   PRN Meds: sodium chloride, acetaminophen, albuterol, guaiFENesin-dextromethorphan, HYDROcodone-acetaminophen, nitroGLYCERIN, ondansetron (ZOFRAN) IV, sodium chloride flush   Vital Signs    Vitals:   03/09/18 0445 03/09/18 0618 03/09/18 0621 03/09/18 0745  BP: (!) 151/99 (!) 163/100  (!) 196/116  Pulse:    (!) 48  Resp:   14 17  Temp:    97.6 F (36.4 C)  TempSrc:    Oral  SpO2:    96%  Weight:      Height:        Intake/Output Summary (Last 24 hours) at 03/09/2018 0753 Last data filed at 03/09/2018 0444 Gross per 24 hour  Intake 1107.1 ml  Output 1200 ml  Net -92.9 ml   Filed Weights   03/08/18 0338 03/09/18 0439  Weight: 146 lb (66.2 kg) 147 lb 11.3 oz (67 kg)    Telemetry    Sinus bradycardia upper 40s-50s - Personally Reviewed  ECG    Sinus bradycardia 48 bpm - Personally Reviewed  Physical Exam   GEN: No acute distress.   Neck: No JVD Cardiac: regular rhythm, bradycardia, no murmurs, rubs, or gallops.  Respiratory: Clear to auscultation bilaterally. GI: Soft, nontender, non-distended  MS: No edema; No deformity. Neuro:  Nonfocal  Psych: Normal affect   Labs    Chemistry Recent Labs  Lab 03/08/18 0351 03/08/18 1313 03/09/18 0023  NA 143  --  141  K 3.0*  --  3.6  CL 109  --  110  CO2 24  --  25  GLUCOSE 115*  --   98  BUN 13  --  9  CREATININE 1.10 1.07 1.04  CALCIUM 8.7*  --  9.0  PROT 7.1  --   --   ALBUMIN 3.6  --   --   AST 18  --   --   ALT 18  --   --   ALKPHOS 104  --   --   BILITOT 0.6  --   --   GFRNONAA >60 >60 >60  GFRAA >60 >60 >60  ANIONGAP 10  --  6     Hematology Recent Labs  Lab 03/08/18 0351 03/08/18 1313 03/09/18 0023  WBC 9.1 9.4 7.8  RBC 4.35 4.19* 4.10*  HGB 13.3 13.1 12.7*  HCT 41.1 38.8* 37.7*  MCV 94.5 92.6 92.0  MCH 30.6 31.3 31.0  MCHC 32.4 33.8 33.7  RDW 13.2 13.6 13.3  PLT 229 192 179    Cardiac Enzymes Recent Labs  Lab 03/08/18 0612 03/08/18 1313 03/08/18 1825 03/09/18 0023  TROPONINI 0.13* 2.96* 5.33* 6.08*    Recent Labs  Lab 03/08/18 0347  TROPIPOC 0.01     BNP Recent Labs  Lab 03/08/18 0352  BNP 65.0     DDimer No  results for input(s): DDIMER in the last 168 hours.   Radiology    Dg Chest Port 1 View  Result Date: 03/08/2018 CLINICAL DATA:  Mid chest pain, nausea.  Smoker. EXAM: PORTABLE CHEST 1 VIEW COMPARISON:  Chest radiograph January 24, 2016 FINDINGS: The heart size and mediastinal contours are within normal limits. Both lungs are clear. Mild hyperinflation. The visualized skeletal structures are nonsuspicious. RIGHT antecubital intravenous catheter. IMPRESSION: Mild hyperinflation without focal consolidation. Electronically Signed   By: Elon Alas M.D.   On: 03/08/2018 04:18    Cardiac Studies   LHC 03/08/18 Procedures   CORONARY STENT INTERVENTION  LEFT HEART CATH AND CORONARY ANGIOGRAPHY  Conclusion    Acute coronary syndrome with non-ST elevation myocardial infarction presentation due to thrombotic 99% stenosis in the mid to distal circumflex proximal to previous distal stent.  Left dominant coronary anatomy  Normal left main  50% stenosis in the proximal to mid LAD.  Small first diagonal contains 70% ostial narrowing.  Right coronary is nondominant and contains both acute marginal and distal disease  of 70-85%.  Successful PTCA and stent implantation overlapping the previously placed distal stent proximally.  Reduction in 99% stenosis with TIMI grade III flow to 0% with TIMI grade III flow.  RECOMMENDATIONS:   Aggressive risk factor modification including smoking cessation and blood pressure control.  Added low-dose diuretic therapy to his regimen.  The combination of clonidine and carvedilol will probably not be sustainable because of bradycardia.  Plavix 225 mg administered in the lab.  Continue chronic Plavix therapy.  Eligible for discharge in a.m. if no complications.   High intensity statin therapy has been added.   Left Ventricle The left ventricular size is normal. The left ventricular systolic function is normal. LV end diastolic pressure is normal. The left ventricular ejection fraction is 50-55% by visual estimate. There are LV function abnormalities due to segmental dysfunction.     Patient Profile     [name] is a 67 y.o. male with past medical history of CAD (s/p STEMI in 08/2012 with DES to [place] in 08/2012), HTN, HLD, and tobacco use who initially presented to [facility] ED on 03/08/18 with unstable angina and was transferred to [facility] for cardiac catheterization.   Assessment & Plan    1. CAD/NSTEMI:  Troponin 6.08. LHC yesterday showed thrombotic 99% stenosis in the mid to distal circumflex proximal to previous distal stent. S/p successful PTCA and stent implantation overlapping the previously placed distal stent proximally. 50% stenosis in the proximal to mid LAD.  Small first diagonal contains 70% ostial narrowing. Right coronary is nondominant and contains both acute marginal and distal disease of 70-85%. Normal LVEF. Continue medical therapy for residual disease. He is currently CP free. No dyspnea. Right radial cath site is stable. Plan for DAPT w/ ASA and Plavix for a minimum of 1 year. Continue high dose statin therapy w/ Lipitor. No BB due to bradycardia.  Continue antihypertensives for BP control. Ambulate with cardiac rehab.   2. Sinus Bradycardia: HR in the upper 40s and low 50s. BP elevated. No further CP. He feels "lousy". Coreg held last night. Clonidine also held.  Avoid any further use of AV nodal blocking agents for now. Continue to monitor on tele. If no improvement in HR after BB and clonidine washout, may consider ETT to check for chronotropic incompetence.  3. HTN: severely elevated today. Systolic blood pressure in the 190s. Diastolic in the 951O. Suspect likely rebound hypertension from discontinuation  of clonidine and coreg for bradycardia. Low dose hydrochlorothiazide was started yesterday. He is also on Losartan 50 mg BID and amlodipine 5 mg. Can further increase HCTZ and even amlodipine. Will continue to monitor closely this morning.   4. HLD: LDL 78 mg/dL. Lipitor 80 mg added on admit (was not taking prior). Recheck FLP and HFTs in 6-8 weeks. Goal LDL is < 70 mg/dL. If not at goal in 6-8 weeks, can add Zetia.   5. Tobacco Abuse: smoking cessation advised.   Dispo: possible d/c home later today depending of BP and HR.   For questions or updates, please contact [place] Please consult [email or web address] for contact info under Cardiology/STEMI.      Signed, Lyda Jester, PA-C  03/09/2018, 7:53 AM     Patient seen and examined. Agree with assessment and plan.  No recurrent chest pain. Day 1 s/p NSTEMI with multivessel CAD. Peak troponin 6.08. S/P  DES stent to distal LCX.  Keep in hospital today. BP increased to 206/121 after I had a long discussion with him concerning importance of smoking cessation. Will dc clonidine; probably resume low dose BB.  Increase amlodipine to 10 mg and add nitrates with significant CAD.  Cath site stable. Will need to defer elective hernia surgery with DAPT. Probable DC tomorrow.    Troy Sine, MD, [facility] 03/09/2018 9:55 AM

## 2018-03-09 NOTE — Research (Signed)
AEGIS-II research study:  Patient seen an examined prior to randomization and infusion.  Physical Exam: General appearance: alert and no distress Neck: no carotid bruit, no JVD and thyroid not enlarged, symmetric, no tenderness/mass/nodules Lungs: clear to auscultation bilaterally Heart: regular rate and rhythm Abdomen: soft, non-tender; bowel sounds normal; no masses,  no organomegaly Extremities: extremities normal, atraumatic, no cyanosis or edema Pulses: 2+ and symmetric Skin: Skin color, texture, turgor normal. No rashes or lesions Neurologic: Grossly normal Psych: Pleasant  [place] Class:  Yes, Class I  The patient was given the opportunity to ask any further questions about the study that were not addressed by the research nurse coordinators - he has no further questions at this time.  As the principal investigator for the AEGIS-II Trial at [facility], I have reviewed the chart, including the inclusion and exclusion criteria and attest that the patient meets criteria for the study.  He wishes to further review the study materials and will decide on proceeding in the morning.  Pixie Casino, MD, [facility] [place] Director of the Advanced Lipid Disorders &  Cardiovascular Risk Reduction Clinic Diplomate of the American Board of Clinical Lipidology Attending Cardiologist  Direct Dial: [phone]  Fax: [phone]  Website:  [email or web address]

## 2018-03-10 ENCOUNTER — Encounter (HOSPITAL_COMMUNITY): Payer: Self-pay | Admitting: Cardiology

## 2018-03-10 DIAGNOSIS — I10 Essential (primary) hypertension: Secondary | ICD-10-CM | POA: Diagnosis present

## 2018-03-10 DIAGNOSIS — Z95820 Peripheral vascular angioplasty status with implants and grafts: Secondary | ICD-10-CM

## 2018-03-10 HISTORY — DX: Peripheral vascular angioplasty status with implants and grafts: Z95.820

## 2018-03-10 HISTORY — DX: Essential (primary) hypertension: I10

## 2018-03-10 MED ORDER — CARVEDILOL 3.125 MG PO TABS: 3.1250 mg | ORAL_TABLET | Freq: Two times a day (BID) | ORAL | 6 refills | Status: DC

## 2018-03-10 MED ORDER — ISOSORBIDE MONONITRATE ER 30 MG PO TB24
30.0000 mg | ORAL_TABLET | Freq: Every day | ORAL | Status: DC
  Administered 2018-03-10: 10:00:00 30 mg via ORAL
  Filled 2018-03-10: qty 1

## 2018-03-10 MED ORDER — HYDROCHLOROTHIAZIDE 25 MG PO TABS: 12.5000 mg | ORAL_TABLET | Freq: Every day | ORAL | 6 refills | Status: DC

## 2018-03-10 MED ORDER — ISOSORBIDE MONONITRATE ER 30 MG PO TB24: 30.0000 mg | ORAL_TABLET | Freq: Every day | ORAL | 6 refills | Status: DC

## 2018-03-10 MED ORDER — CARVEDILOL 3.125 MG PO TABS
3.1250 mg | ORAL_TABLET | Freq: Two times a day (BID) | ORAL | Status: DC
  Administered 2018-03-10 (×2): 3.125 mg via ORAL
  Filled 2018-03-10 (×2): qty 1

## 2018-03-10 MED ORDER — AMLODIPINE BESYLATE 10 MG PO TABS: 10.0000 mg | ORAL_TABLET | Freq: Every day | ORAL | 6 refills | Status: DC

## 2018-03-10 MED ORDER — ATORVASTATIN CALCIUM 80 MG PO TABS: 80.0000 mg | ORAL_TABLET | Freq: Every day | ORAL | 6 refills | Status: DC

## 2018-03-10 MED FILL — Heparin Sod (Porcine)-NaCl IV Soln 1000 Unit/500ML-0.9%: INTRAVENOUS | Qty: 1000 | Status: AC

## 2018-03-10 NOTE — Progress Notes (Signed)
1000-1018 BP 176/110 Pt stated he still does not feel like walking. Exhausted just going to bathroom. BP still elevated. Meds being adjusted. Gave pt ex ed and encouraged him to walk with staff later when BP lower. Graylon Good RN BSN 03/10/2018 10:17 AM

## 2018-03-10 NOTE — Discharge Summary (Signed)
Discharge Summary    Patient ID: [name],  MRN: [id number], DOB/AGE: 1951-01-04 67 y.o.  Admit date: 03/08/2018 Discharge date: 03/10/2018  Primary Care Provider: Sinda Du Primary Cardiologist: Cristopher Peru, MD  Discharge Diagnoses    Principal Problem:   NSTEMI (non-ST elevated myocardial infarction) [facility]) Active Problems:   S/P angioplasty with stent to mid to distal LCX with DES 03/08/18    CAD (coronary artery disease)   Tobacco abuse   Dyslipidemia   HTN (hypertension)   Allergies Allergies  Allergen Reactions  . Sulfa Antibiotics Hives    Diagnostic Studies/Procedures    __LHC 03/08/18 Procedures   CORONARY STENT INTERVENTION  LEFT HEART CATH AND CORONARY ANGIOGRAPHY  Conclusion    Acute coronary syndrome with non-ST elevation myocardial infarction presentation due to thrombotic 99% stenosis in the mid to distal circumflex proximal to previous distal stent.  Left dominant coronary anatomy  Normal left main  50% stenosis in the proximal to mid LAD. Small first diagonal contains 70% ostial narrowing.  Right coronary is nondominant and contains both acute marginal and distal disease of 70-85%.  Successful PTCA and stent implantation overlapping the previously placed distal stent proximally. Reduction in 99% stenosis with TIMI grade III flow to 0% with TIMI grade III flow.  RECOMMENDATIONS:   Aggressive risk factor modification including smoking cessation and blood pressure control. Added low-dose diuretic therapy to his regimen. The combination of clonidine and carvedilol will probably not be sustainable because of bradycardia.  Plavix 225 mg administered in the lab. Continue chronic Plavix therapy.  Eligible for discharge in a.m. if no complications.   High intensity statin therapy has been added.   Left Ventricle The left ventricular size is normal. The left ventricular systolic function is normal. LV end diastolic pressure is  normal. The left ventricular ejection fraction is 50-55% by visual estimate. There are LV function abnormalities due to segmental dysfunction.        __________   History of Present Illness     67 y.o. male with past medical history of CAD (s/p STEMI in 08/2012 with DES to [place] in 08/2012), HTN, HLD, and tobacco use presented to [facility] ED 03/08/18 for chest pain.  He also has a hx of difficult to control htn.   Pt had been in his usual state of health until 4/24 when he was awakened from sleep with central chest pain.  Described as a pressure and associated with nausea.  2 NTG at home did not relieve.   His K+ was low at 3.0.  EKG with Sinus brady at 45 and diffuse T wave inversion in setting of LVH.  CXR with mild hyperinflation.    Pt was still with chest pain so pt was transferred to [facility] by EMS for urgent cardiac cath.   Hospital Course     Consultants: none  Cardiac cath as above with stent to mid to distal LCX see above.  Pt tolerated the procedure well.  pk troponin 6.08.  Will need to be on DAPT for 1 year.     The next AM he had no chest pain but did have elevated BP  181/110  His BB had been stopped due to bradycardia and his clonidine was stopped.   On the 25th his amlodipine was increased to 10 mg.  He continued with elevated BP during the night.    By the 26th his HR was in the 50s mostly occ in 60s so placed on  coreg 3.125 BID,  imdur 30 mg daily.    Plan to stay off clonidine to further titrate anti-ischemic meds.   Dr. Claiborne Billings had long discussion with pt about stopping tobacco.    The afternoon of the 26th.    He needs TOC appt with HTN issues and bradycardia.     BP improved at discharge.   _____________  Discharge Vitals Blood pressure (!) 159/101, pulse (!) 57, temperature 98 F (36.7 C), temperature source Oral, resp. rate 17, height 5\' 7"  (1.702 m), weight 141 lb 1.5 oz (64 kg), SpO2 97 %.  Filed Weights   03/08/18 0338 03/09/18 0439 03/10/18 0356    Weight: 146 lb (66.2 kg) 147 lb 11.3 oz (67 kg) 141 lb 1.5 oz (64 kg)    Labs & Radiologic Studies    CBC Recent Labs    03/08/18 0351 03/08/18 1313 03/09/18 0023  WBC 9.1 9.4 7.8  NEUTROABS 5.5  --   --   HGB 13.3 13.1 12.7*  HCT 41.1 38.8* 37.7*  MCV 94.5 92.6 92.0  PLT 229 192 017   Basic Metabolic Panel Recent Labs    03/08/18 0351 03/08/18 1313 03/09/18 0023  NA 143  --  141  K 3.0*  --  3.6  CL 109  --  110  CO2 24  --  25  GLUCOSE 115*  --  98  BUN 13  --  9  CREATININE 1.10 1.07 1.04  CALCIUM 8.7*  --  9.0   Liver Function Tests Recent Labs    03/08/18 0351  AST 18  ALT 18  ALKPHOS 104  BILITOT 0.6  PROT 7.1  ALBUMIN 3.6   No results for input(s): LIPASE, AMYLASE in the last 72 hours. Cardiac Enzymes Recent Labs    03/08/18 1313 03/08/18 1825 03/09/18 0023  TROPONINI 2.96* 5.33* 6.08*   BNP Invalid input(s): POCBNP D-Dimer No results for input(s): DDIMER in the last 72 hours. Hemoglobin A1C No results for input(s): HGBA1C in the last 72 hours. Fasting Lipid Panel Recent Labs    03/09/18 0023  CHOL 131  HDL 32*  LDLCALC 78  TRIG 104  CHOLHDL 4.1   Thyroid Function Tests No results for input(s): TSH, T4TOTAL, T3FREE, THYROIDAB in the last 72 hours.  Invalid input(s): FREET3 _____________  Dg Chest Port 1 View  Result Date: 03/08/2018 CLINICAL DATA:  Mid chest pain, nausea.  Smoker. EXAM: PORTABLE CHEST 1 VIEW COMPARISON:  Chest radiograph January 24, 2016 FINDINGS: The heart size and mediastinal contours are within normal limits. Both lungs are clear. Mild hyperinflation. The visualized skeletal structures are nonsuspicious. RIGHT antecubital intravenous catheter. IMPRESSION: Mild hyperinflation without focal consolidation. Electronically Signed   By: Elon Alas M.D.   On: 03/08/2018 04:18   Disposition   Pt is being discharged home today in good condition.  Follow-up Plans & Appointments   Call [facility] at [phone] if any bleeding, swelling or drainage at cath site.  May shower, no tub baths for 48 hours for groin sticks. No lifting over 5 pounds for 5 days.  No Driving for 5 days  Heart Healthy diet, low salt to help with BP  Do not stop asprin and plavix important to keep stent open.  Call if any medication questions.  Or any questions.   We adjusted you medications please review     Follow-up Information    Evans Lance, MD Follow up on 03/17/2018.   Specialty:  Cardiology Why:  at 3:15 Pm Contact information: [address] [place] [address] [phone]          Discharge Instructions    Amb Referral to Cardiac Rehabilitation   Complete by:  As directed    Diagnosis:   NSTEMI Coronary Stents        Discharge Medications   Allergies as of 03/10/2018      Reactions   Sulfa Antibiotics Hives      Medication List    STOP taking these medications   cloNIDine 0.1 MG tablet Commonly known as:  CATAPRES     TAKE these medications   albuterol 108 (90 Base) MCG/ACT inhaler Commonly known as:  PROVENTIL HFA;VENTOLIN HFA Inhale 2 puffs into the lungs every 6 (six) hours as needed for wheezing or shortness of breath.   amLODipine 10 MG tablet Commonly known as:  NORVASC Take 1 tablet (10 mg total) by mouth daily. Start taking on:  03/11/2018 What changed:    medication strength  how much to take   aspirin 81 MG EC tablet Take 1 tablet (81 mg total) by mouth daily. What changed:  when to take this   atorvastatin 80 MG tablet Commonly known as:  LIPITOR Take 1 tablet (80 mg total) by mouth daily at 6 PM.   carvedilol 3.125 MG tablet Commonly known as:  COREG Take 1 tablet (3.125 mg total) by mouth 2 (two) times daily with a meal. What changed:    medication strength  See the new instructions.   clopidogrel 75 MG tablet Commonly known as:  PLAVIX TAKE ONE (1) TABLET BY MOUTH EVERY DAY   hydrochlorothiazide  25 MG tablet Commonly known as:  HYDRODIURIL Take 0.5 tablets (12.5 mg total) by mouth daily.   HYDROcodone-acetaminophen 5-325 MG tablet Commonly known as:  NORCO/VICODIN Take 2 tablets by mouth every 4 (four) hours as needed.   isosorbide mononitrate 30 MG 24 hr tablet Commonly known as:  IMDUR Take 1 tablet (30 mg total) by mouth daily. Start taking on:  03/11/2018   losartan 50 MG tablet Commonly known as:  COZAAR TAKE ONE TABLET BY MOUTH TWICE A DAY   nitroGLYCERIN 0.4 MG SL tablet Commonly known as:  NITROSTAT PLACE ONE TABLET UNDER THE TONGUE EVERY FIVE MINUTES FOR THREE DOSES AS NEEDED FOR CHEST PAIN        Aspirin prescribed at discharge?  Yes High Intensity Statin Prescribed? (Lipitor 40-80mg  or Crestor 20-40mg ): Yes Beta Blocker Prescribed? Yes For EF <40%, was ACEI/ARB Prescribed? Yes ADP Receptor Inhibitor Prescribed? (i.e. Plavix etc.-Includes Medically Managed Patients): yes For EF <40%, Aldosterone Inhibitor Prescribed? No: EF 63 Was EF assessed during THIS hospitalization? Yes Was Cardiac Rehab II ordered? (Included Medically managed Patients): Yes   Outstanding Labs/Studies   BMP if appropriate  Duration of Discharge Encounter   Greater than 30 minutes including physician time.  Signed, Huel Coventry, NP 03/10/2018, 1:11 PM

## 2018-03-10 NOTE — Discharge Instructions (Signed)
Call [facility] : [facility] at [phone] if any bleeding, swelling or drainage at cath site.  May shower, no tub baths for 48 hours for groin sticks. No lifting over 5 pounds for 5 days.  No Driving for 5 days  Heart Healthy diet, low salt to help with BP  Do not stop asprin and plavix important to keep stent open.  Call if any medication questions.  Or any questions.   We adjusted you medications please review

## 2018-03-10 NOTE — Research (Signed)
Pt decided not to participate, in the Aegis II trial.  His blood pressure is high and he just don't feel good.  Thanked him for considering the trial.

## 2018-03-10 NOTE — Progress Notes (Addendum)
Progress Note  Patient Name: [name] Date of Encounter: 03/10/2018  Primary Cardiologist: Cristopher Peru, MD   Subjective   Pt does not feel well, BP still elevated.  No chest pain, occ SOB  Pt does not wish to participate in AEGIS -II research study.  Inpatient Medications    Scheduled Meds: . amLODipine  10 mg Oral Daily  . aspirin EC  81 mg Oral Daily  . atorvastatin  80 mg Oral q1800  . clopidogrel  75 mg Oral Q breakfast  . hydrochlorothiazide  12.5 mg Oral Daily  . losartan  50 mg Oral BID  . sodium chloride flush  3 mL Intravenous Q12H   Continuous Infusions: . sodium chloride    . nitroGLYCERIN Stopped (03/08/18 1515)   PRN Meds: sodium chloride, acetaminophen, albuterol, guaiFENesin, HYDROcodone-acetaminophen, nitroGLYCERIN, ondansetron (ZOFRAN) IV, sodium chloride flush   Vital Signs    Vitals:   03/09/18 2000 03/09/18 2152 03/10/18 0356 03/10/18 0609  BP: (!) 181/110 (!) 171/106 (!) 172/108 (!) 169/106  Pulse: (!) 54  (!) 53   Resp: 18 13 14 18   Temp: 98.1 F (36.7 C)  97.9 F (36.6 C)   TempSrc: Oral  Oral   SpO2: 97%  97%   Weight:   141 lb 1.5 oz (64 kg)   Height:        Intake/Output Summary (Last 24 hours) at 03/10/2018 0803 Last data filed at 03/09/2018 1832 Gross per 24 hour  Intake -  Output 175 ml  Net -175 ml   Filed Weights   03/08/18 0338 03/09/18 0439 03/10/18 0356  Weight: 146 lb (66.2 kg) 147 lb 11.3 oz (67 kg) 141 lb 1.5 oz (64 kg)    Telemetry    SB upper 40s to 50s[ - Personally Reviewed  ECG    No new since yesterday - Personally Reviewed  Physical Exam   GEN: No acute distress.   Neck: No JVD Cardiac: RRR, no murmurs, rubs, or gallops.  Respiratory: Clear to auscultation bilaterally. GI: Soft, nontender, non-distended  MS: No edema; No deformity. Neuro:  Nonfocal  Psych: Normal affect   Labs    Chemistry Recent Labs  Lab 03/08/18 0351 03/08/18 1313 03/09/18 0023  NA 143  --  141  K 3.0*  --  3.6    CL 109  --  110  CO2 24  --  25  GLUCOSE 115*  --  98  BUN 13  --  9  CREATININE 1.10 1.07 1.04  CALCIUM 8.7*  --  9.0  PROT 7.1  --   --   ALBUMIN 3.6  --   --   AST 18  --   --   ALT 18  --   --   ALKPHOS 104  --   --   BILITOT 0.6  --   --   GFRNONAA >60 >60 >60  GFRAA >60 >60 >60  ANIONGAP 10  --  6     Hematology Recent Labs  Lab 03/08/18 0351 03/08/18 1313 03/09/18 0023  WBC 9.1 9.4 7.8  RBC 4.35 4.19* 4.10*  HGB 13.3 13.1 12.7*  HCT 41.1 38.8* 37.7*  MCV 94.5 92.6 92.0  MCH 30.6 31.3 31.0  MCHC 32.4 33.8 33.7  RDW 13.2 13.6 13.3  PLT 229 192 179    Cardiac Enzymes Recent Labs  Lab 03/08/18 0612 03/08/18 1313 03/08/18 1825 03/09/18 0023  TROPONINI 0.13* 2.96* 5.33* 6.08*    Recent Labs  Lab 03/08/18  0347  TROPIPOC 0.01     BNP Recent Labs  Lab 03/08/18 0352  BNP 65.0     DDimer No results for input(s): DDIMER in the last 168 hours.   Radiology    No results found.  Cardiac Studies   LHC 03/08/18 Procedures   CORONARY STENT INTERVENTION  LEFT HEART CATH AND CORONARY ANGIOGRAPHY  Conclusion    Acute coronary syndrome with non-ST elevation myocardial infarction presentation due to thrombotic 99% stenosis in the mid to distal circumflex proximal to previous distal stent.  Left dominant coronary anatomy  Normal left main  50% stenosis in the proximal to mid LAD. Small first diagonal contains 70% ostial narrowing.  Right coronary is nondominant and contains both acute marginal and distal disease of 70-85%.  Successful PTCA and stent implantation overlapping the previously placed distal stent proximally. Reduction in 99% stenosis with TIMI grade III flow to 0% with TIMI grade III flow.  RECOMMENDATIONS:   Aggressive risk factor modification including smoking cessation and blood pressure control. Added low-dose diuretic therapy to his regimen. The combination of clonidine and carvedilol will probably not be sustainable  because of bradycardia.  Plavix 225 mg administered in the lab. Continue chronic Plavix therapy.  Eligible for discharge in a.m. if no complications.   High intensity statin therapy has been added.   Left Ventricle The left ventricular size is normal. The left ventricular systolic function is normal. LV end diastolic pressure is normal. The left ventricular ejection fraction is 50-55% by visual estimate. There are LV function abnormalities due to segmental dysfunction.        Patient Profile     67 y.o. male history of CAD (s/p STEMI in 08/2012 with DES to [place] in 08/2012), HTN, HLD, and tobacco use who initially presented to [facility] ED on 03/08/18 with unstable angina, NSTEMI and bradycardia, transferred to [facility] for cath.    Assessment & Plan    HTN uncontrolled, his clonidine was stopped suddenly, from 0.3 mg daily -BB stopped due to bradycardia.  HR still slow will discuss with Dr. Claiborne Billings about starting back on clondine or hydralazine.  He just rec'd AM meds.  Amlodipine at 10 mg and HCTZ 12.5 mg , cozaar at 50 BID.  Dr. Claiborne Billings to address the BP  CAD/NSTEMI with pk troponin of 6.08  - residual disease after stent to LCX.  No chest pain some SOB at times may be related to BP  S/p stent of LCX  DAPT for 1 year.    Sinus Brady HR in upper 40s and 50s  Rate still low.  HLD controlled but now on Lipitor 80 --Recheck FLP and HFTs in 6-8 weeks. Goal LDL is < 70 mg/dL. If not at goal in 6-8 weeks, can add Zetia  Tobacco abuse.  Advised to stop    For questions or updates, please contact [place] Please consult [email or web address] for contact info under Cardiology/STEMI.      Signed, Cecilie Kicks, NP  03/10/2018, 8:03 AM    Patient seen and examined. Agree with assessment and plan. Resting pulse now in upper 50s to low 60s; will resume carvedilol at lower dose today 3.125 mg bid with plan to titrate next week to 6.25 mg bid. Would stay off clonidine for now to allow further  titration of anti-ischemic meds.  Add nitrates with concomitant CAD. Ambulate this am; probable DC this afternoon. Smoking cessation is imperative.    Troy Sine, MD, [facility] 03/10/2018 9:17 AM

## 2018-03-13 ENCOUNTER — Telehealth: Payer: Self-pay | Admitting: Internal Medicine

## 2018-03-13 NOTE — Telephone Encounter (Signed)
Pt is allergic to Sulfa and says he can't take the hydrochlorothiazide (HYDRODIURIL) 25 MG tablet [[id number]]

## 2018-03-13 NOTE — Telephone Encounter (Signed)
RE: allergic to HCTZ  Received: Today  Message Contents  Timothy Serge, [name]  Bernita Raisin, RN        Has he had it before? If not he had no reaction in hospital and many people allergic to sulfa can take HCTZ. If he has had a reaction to HCTZ then stop but only if he has Reactine.    He is to see Dr. Lovena Le on the 2nd. Ask him to check his BP and if it is greater than 150/85 to let us know in the meantime.        Patient stopped HCTZ when he got home out of fear.Will re-start and observe for any reaction

## 2018-03-13 NOTE — Telephone Encounter (Signed)
Message sent to Cecilie Kicks NP

## 2018-03-16 ENCOUNTER — Ambulatory Visit: Payer: PPO | Admitting: Internal Medicine

## 2018-03-16 ENCOUNTER — Encounter: Payer: Self-pay | Admitting: Internal Medicine

## 2018-03-16 VITALS — BP 164/110 | HR 75 | Ht 66.0 in | Wt 137.6 lb

## 2018-03-16 DIAGNOSIS — I119 Hypertensive heart disease without heart failure: Secondary | ICD-10-CM

## 2018-03-16 DIAGNOSIS — I2510 Atherosclerotic heart disease of native coronary artery without angina pectoris: Secondary | ICD-10-CM | POA: Diagnosis not present

## 2018-03-16 DIAGNOSIS — Z720 Tobacco use: Secondary | ICD-10-CM | POA: Diagnosis not present

## 2018-03-16 DIAGNOSIS — I10 Essential (primary) hypertension: Secondary | ICD-10-CM | POA: Diagnosis not present

## 2018-03-16 MED ORDER — CARVEDILOL 6.25 MG PO TABS: 6.2500 mg | ORAL_TABLET | Freq: Two times a day (BID) | ORAL | 3 refills | Status: DC

## 2018-03-16 NOTE — Patient Instructions (Signed)
Medication Instructions:  Your physician has recommended you make the following change in your medication:  Increase Coreg to 6.25 Two Times Daily  ( If Heart rate is below 50 decrease Coreg to 3.125 Two Times Daily)    Labwork: NONE   Testing/Procedures: NONE   Follow-Up: Your physician wants you to follow-up in: 6 Months You will receive a reminder letter in the mail two months in advance. If you don't receive a letter, please call our office to schedule the follow-up appointment.   Any Other Special Instructions Will Be Listed Below (If Applicable).     If you need a refill on your cardiac medications before your next appointment, please call your pharmacy.

## 2018-03-16 NOTE — Progress Notes (Signed)
HPI [name] returns today for evaluation of HTN, sinus node dysfunction, CAD, s/p MI, and ongoing tobacco abuse. I saw the patient 2 months ago and a month later he awoke with sscp and presdented with a NSTEMI having a 99% LCX. He was successfully stented. He has done well in the interim with no chest pain. He still has not stopped smoking.  The patient's blood pressure treatment is limited by his sinus node dysfunction and he is only taking low-dose carvedilol. Allergies  Allergen Reactions  . Sulfa Antibiotics Hives     Current Outpatient Medications  Medication Sig Dispense Refill  . albuterol (PROVENTIL HFA;VENTOLIN HFA) 108 (90 Base) MCG/ACT inhaler Inhale 2 puffs into the lungs every 6 (six) hours as needed for wheezing or shortness of breath.    Marland Kitchen amLODipine (NORVASC) 10 MG tablet Take 1 tablet (10 mg total) by mouth daily. 30 tablet 6  . aspirin EC 81 MG EC tablet Take 1 tablet (81 mg total) by mouth daily. (Patient taking differently: Take 81 mg by mouth at bedtime. )    . atorvastatin (LIPITOR) 80 MG tablet Take 1 tablet (80 mg total) by mouth daily at 6 PM. 30 tablet 6  . carvedilol (COREG) 3.125 MG tablet Take 1 tablet (3.125 mg total) by mouth 2 (two) times daily with a meal. 60 tablet 6  . clopidogrel (PLAVIX) 75 MG tablet TAKE ONE (1) TABLET BY MOUTH EVERY DAY 90 tablet 3  . hydrochlorothiazide (HYDRODIURIL) 25 MG tablet Take 0.5 tablets (12.5 mg total) by mouth daily. 16 tablet 6  . HYDROcodone-acetaminophen (NORCO/VICODIN) 5-325 MG tablet Take 2 tablets by mouth every 4 (four) hours as needed. 6 tablet 0  . isosorbide mononitrate (IMDUR) 30 MG 24 hr tablet Take 1 tablet (30 mg total) by mouth daily. 30 tablet 6  . losartan (COZAAR) 50 MG tablet TAKE ONE TABLET BY MOUTH TWICE A DAY 180 tablet 3  . nitroGLYCERIN (NITROSTAT) 0.4 MG SL tablet PLACE ONE TABLET UNDER THE TONGUE EVERY FIVE MINUTES FOR THREE DOSES AS NEEDED FOR CHEST PAIN 25 tablet 3   No current  facility-administered medications for this visit.      Past Medical History:  Diagnosis Date  . Acute MI ([place]) 08/2012  . CAD (coronary artery disease)    a. 08/2012 Inferolateral STEMI/Cath/PCI: LM nl, LAD minor irregs, RI 20p, LCX nl, OM1 100d (Tx w 2.75x3mm Promus DES), RCA nondom, minor irregs, EF60-65%.  . Chronic bronchitis ([place])   . Environmental allergies   . GERD (gastroesophageal reflux disease)   . History of gout X 1  . History of hiatal hernia   . History of kidney stones   . HTN (hypertension) 03/10/2018  . Hypertension   . NSTEMI (non-ST elevated myocardial infarction) ([place]) 03/08/2018  . S/P angioplasty with stent to mid to distal LCX with DES 03/08/18  03/10/2018  . Tobacco abuse     ROS:   All systems reviewed and negative except as noted in the HPI.   Past Surgical History:  Procedure Laterality Date  . APPENDECTOMY    . CHOLECYSTECTOMY    . CORONARY STENT INTERVENTION N/A 03/08/2018   Procedure: CORONARY STENT INTERVENTION;  Surgeon: Belva Crome, MD;  Location: [facility] CV LAB;  Service: Cardiovascular;  Laterality: N/A;  . LEFT HEART CATH AND CORONARY ANGIOGRAPHY N/A 03/08/2018   Procedure: LEFT HEART CATH AND CORONARY ANGIOGRAPHY;  Surgeon: Belva Crome, MD;  Location: [facility] CV LAB;  Service: Cardiovascular;  Laterality: N/A;  . LEFT HEART CATHETERIZATION WITH CORONARY ANGIOGRAM N/A 08/25/2012   Procedure: LEFT HEART CATHETERIZATION WITH CORONARY ANGIOGRAM;  Surgeon: Hillary Bow, MD;  Location: [facility] CATH LAB;  Service: Cardiovascular;  Laterality: N/A;  . PERCUTANEOUS CORONARY STENT INTERVENTION (PCI-S) Left 08/25/2012   Procedure: PERCUTANEOUS CORONARY STENT INTERVENTION (PCI-S);  Surgeon: Hillary Bow, MD;  Location: [facility] CATH LAB;  Service: Cardiovascular;  Laterality: Left;  stent x 1 om     Family History  Problem Relation Age of Onset  . Hypertension Mother   . Hypertension Father   . Stroke Father      Social History    Socioeconomic History  . Marital status: Legally Separated    Spouse name: Not on file  . Number of children: Not on file  . Years of education: Not on file  . Highest education level: Not on file  Occupational History  . Not on file  Social Needs  . Financial resource strain: Not on file  . Food insecurity:    Worry: Not on file    Inability: Not on file  . Transportation needs:    Medical: Not on file    Non-medical: Not on file  Tobacco Use  . Smoking status: Current Every Day Smoker    Packs/day: 0.75    Years: 50.00    Pack years: 37.50    Types: Cigarettes    Start date: 03/14/1968  . Smokeless tobacco: Never Used  Substance and Sexual Activity  . Alcohol use: No    Alcohol/week: 0.0 oz  . Drug use: No  . Sexual activity: Yes  Lifestyle  . Physical activity:    Days per week: Not on file    Minutes per session: Not on file  . Stress: Not on file  Relationships  . Social connections:    Talks on phone: Not on file    Gets together: Not on file    Attends religious service: Not on file    Active member of club or organization: Not on file    Attends meetings of clubs or organizations: Not on file    Relationship status: Not on file  . Intimate partner violence:    Fear of current or ex partner: Not on file    Emotionally abused: Not on file    Physically abused: Not on file    Forced sexual activity: Not on file  Other Topics Concern  . Not on file  Social History Narrative  . Not on file     BP (!) 164/110 (BP Location: Right Arm, Cuff Size: Normal)   Pulse 75   Ht 5\' 6"  (1.676 m)   Wt 137 lb 9.6 oz (62.4 kg)   SpO2 98%   BMI 22.21 kg/m   Physical Exam:  Well appearing 67 year old man, NAD HEENT: Unremarkable Neck: 6 cm JVD, no thyromegally Lymphatics:  No adenopathy Back:  No CVA tenderness Lungs:  Clear, with no wheezes, rales, or rhonchi HEART:  Regular rate rhythm, no murmurs, no rubs, no clicks, soft S4 Abd:  soft, positive bowel  sounds, no organomegally, no rebound, no guarding Ext:  2 plus pulses, no edema, no cyanosis, no clubbing Skin:  No rashes no nodules Neuro:  CN II through XII intact, motor grossly intact  EKG normal sinus rhythm with -left axis deviation and left ventricular hypertrophy  Assess/Plan: 1.  Coronary artery disease -he is status post recent MI and has no anginal symptoms.  He  will continue his current medications, except we will attempt to uptitrate his carvedilol. 2.  Hypertensive heart disease -his blood pressure is still elevated.  We will attempt to increase his carvedilol.  I encouraged the patient to reduce his salt intake.  He is not overweight. 3.  Sinus node dysfunction -he is asymptomatic.  There is no indication for pacemaker at this time. 4.  Tobacco abuse -I strongly encourage the patient to stop smoking.  He has cut back, and hopes to eventually stop.  Cristopher Peru, MD

## 2018-03-17 ENCOUNTER — Ambulatory Visit: Payer: PPO | Admitting: Internal Medicine

## 2018-04-06 ENCOUNTER — Telehealth: Payer: Self-pay | Admitting: Internal Medicine

## 2018-04-06 NOTE — Telephone Encounter (Signed)
Pt's meds are giving him indigestion and he's wanting to know if Dr. Lovena Le could write him a Rx to help it. Pt uses [employer]

## 2018-04-06 NOTE — Telephone Encounter (Signed)
Patient drank milk at bedtime then had indigestion,he states he knows it is a problem and will avoid.i told him about lactaide pills.He says he has used Gas-x with relief and will continue to do so.i encouraged him to call pcp if sx's worsen, he agreed

## 2018-06-15 ENCOUNTER — Encounter: Payer: Self-pay | Admitting: General Surgery

## 2018-06-15 ENCOUNTER — Ambulatory Visit: Payer: PPO | Admitting: General Surgery

## 2018-06-15 VITALS — BP 131/88 | HR 59 | Temp 98.6°F | Resp 16 | Wt 133.0 lb

## 2018-06-15 DIAGNOSIS — K4090 Unilateral inguinal hernia, without obstruction or gangrene, not specified as recurrent: Secondary | ICD-10-CM

## 2018-06-15 NOTE — Progress Notes (Signed)
[name]; [id number]; [birth year]   HPI Patient is a 67 year old black male who was referred to my care by Dr. Luan Pulling for evaluation and treatment of a right inguinal hernia.  He states he has had the hernia for many months.  It does stick out when he strains or stands for long time.  It reduces on its own while lying down.  He denies any nausea or vomiting.  He currently has 0 out of 10 groin pain.  He did have an MRI with stent placement in April of this year.  He is currently on Plavix.  He denies any chest pain.  He will be following up with his cardiologist next month. Past Medical History:  Diagnosis Date  . Acute MI ([place]) 08/2012  . CAD (coronary artery disease)    a. 08/2012 Inferolateral STEMI/Cath/PCI: LM nl, LAD minor irregs, RI 20p, LCX nl, OM1 100d (Tx w 2.75x48mm Promus DES), RCA nondom, minor irregs, EF60-65%.  . Chronic bronchitis ([place])   . Environmental allergies   . GERD (gastroesophageal reflux disease)   . History of gout X 1  . History of hiatal hernia   . History of kidney stones   . HTN (hypertension) 03/10/2018  . Hypertension   . NSTEMI (non-ST elevated myocardial infarction) ([place]) 03/08/2018  . S/P angioplasty with stent to mid to distal LCX with DES 03/08/18  03/10/2018  . Tobacco abuse     Past Surgical History:  Procedure Laterality Date  . APPENDECTOMY    . CHOLECYSTECTOMY    . CORONARY STENT INTERVENTION N/A 03/08/2018   Procedure: CORONARY STENT INTERVENTION;  Surgeon: Belva Crome, MD;  Location: [place] [facility];  Service: Cardiovascular;  Laterality: N/A;  . LEFT HEART CATH AND CORONARY ANGIOGRAPHY N/A 03/08/2018   Procedure: LEFT HEART CATH AND CORONARY ANGIOGRAPHY;  Surgeon: Belva Crome, MD;  Location: [place] [facility];  Service: Cardiovascular;  Laterality: N/A;  . LEFT HEART CATHETERIZATION WITH CORONARY ANGIOGRAM N/A 08/25/2012   Procedure: LEFT HEART CATHETERIZATION WITH CORONARY ANGIOGRAM;  Surgeon: Hillary Bow, MD;  Location: [facility]  CATH LAB;  Service: Cardiovascular;  Laterality: N/A;  . PERCUTANEOUS CORONARY STENT INTERVENTION (PCI-S) Left 08/25/2012   Procedure: PERCUTANEOUS CORONARY STENT INTERVENTION (PCI-S);  Surgeon: Hillary Bow, MD;  Location: [facility] CATH LAB;  Service: Cardiovascular;  Laterality: Left;  stent x 1 om    Family History  Problem Relation Age of Onset  . Hypertension Mother   . Hypertension Father   . Stroke Father     Current Outpatient Medications on File Prior to Visit  Medication Sig Dispense Refill  . albuterol (PROVENTIL HFA;VENTOLIN HFA) 108 (90 Base) MCG/ACT inhaler Inhale 2 puffs into the lungs every 6 (six) hours as needed for wheezing or shortness of breath.    Marland Kitchen amLODipine (NORVASC) 10 MG tablet Take 1 tablet (10 mg total) by mouth daily. 30 tablet 6  . aspirin EC 81 MG EC tablet Take 1 tablet (81 mg total) by mouth daily. (Patient taking differently: Take 81 mg by mouth at bedtime. )    . atorvastatin (LIPITOR) 80 MG tablet Take 1 tablet (80 mg total) by mouth daily at 6 PM. 30 tablet 6  . carvedilol (COREG) 6.25 MG tablet Take 1 tablet (6.25 mg total) by mouth 2 (two) times daily. 180 tablet 3  . clopidogrel (PLAVIX) 75 MG tablet TAKE ONE (1) TABLET BY MOUTH EVERY DAY 90 tablet 3  . hydrochlorothiazide (HYDRODIURIL) 25 MG tablet Take 0.5  tablets (12.5 mg total) by mouth daily. 16 tablet 6  . HYDROcodone-acetaminophen (NORCO/VICODIN) 5-325 MG tablet Take 2 tablets by mouth every 4 (four) hours as needed. 6 tablet 0  . isosorbide mononitrate (IMDUR) 30 MG 24 hr tablet Take 1 tablet (30 mg total) by mouth daily. 30 tablet 6  . losartan (COZAAR) 50 MG tablet TAKE ONE TABLET BY MOUTH TWICE A DAY 180 tablet 3  . nitroGLYCERIN (NITROSTAT) 0.4 MG SL tablet PLACE ONE TABLET UNDER THE TONGUE EVERY FIVE MINUTES FOR THREE DOSES AS NEEDED FOR CHEST PAIN 25 tablet 3   No current facility-administered medications on file prior to visit.     Allergies  Allergen Reactions  . Sulfa Antibiotics  Hives    Social History   Substance and Sexual Activity  Alcohol Use No  . Alcohol/week: 0.0 oz    Social History   Tobacco Use  Smoking Status Current Every Day Smoker  . Packs/day: 0.75  . Years: 50.00  . Pack years: 37.50  . Types: Cigarettes  . Start date: 03/14/1968  Smokeless Tobacco Never Used    Review of Systems  Constitutional: Negative.   HENT: Negative.   Eyes: Negative.   Respiratory: Negative.   Cardiovascular: Negative.   Gastrointestinal: Negative.   Genitourinary: Negative.   Musculoskeletal: Negative.   Skin: Negative.   Neurological: Negative.   Endo/Heme/Allergies: Negative.   Psychiatric/Behavioral: Negative.     Objective   Vitals:   06/15/18 1023  BP: 131/88  Pulse: (!) 59  Resp: 16  Temp: 98.6 F (37 C)    Physical Exam  Constitutional: He is oriented to person, place, and time. He appears well-developed and well-nourished. No distress.  HENT:  Head: Normocephalic and atraumatic.  Cardiovascular: Normal rate, regular rhythm and normal heart sounds. Exam reveals no gallop and no friction rub.  No murmur heard. Pulmonary/Chest: Effort normal. No stridor. No respiratory distress. He has no wheezes. He has no rales.  Abdominal: Soft. Bowel sounds are normal. He exhibits no distension and no mass. There is no tenderness. There is no guarding. A hernia is present.  Easily reducible right inguinal hernia  Neurological: He is alert and oriented to person, place, and time.  Skin: Skin is warm and dry.  Vitals reviewed. Cardiology notes reviewed.  Dr. Luan Pulling notes reviewed.  Assessment  Reducible right inguinal hernia Plan   I told the patient that I would have the hernia fixed once it is causing him more significant discomfort.  He has not had an episode of incarceration.  As he has had a recent MI, I would ideally like to wait on any surgical intervention until he is cleared by cardiology.  He understands this and agrees.  He will  mention this to his cardiologist at his visit next month.  He will need to be off his antiplatelet medication prior to surgical intervention.  Follow-up as needed.  He will call to schedule surgery once cleared by cardiology.

## 2018-06-15 NOTE — Patient Instructions (Signed)

## 2018-07-19 ENCOUNTER — Telehealth: Payer: Self-pay | Admitting: Internal Medicine

## 2018-07-19 NOTE — Telephone Encounter (Signed)
Patient states that his blood thinner is affecting his erectile function. Wants to know if there is anything he can take to help that. / tg

## 2018-07-19 NOTE — Telephone Encounter (Signed)
I will forward to Dr.Taylor °

## 2018-07-19 NOTE — Telephone Encounter (Signed)
Per Dr. Lovena Le- he cannot take any medication for erectile dysfunction because of the medication he is on for his heart.

## 2018-07-19 NOTE — Telephone Encounter (Signed)
He cannot take any ED meds as he is chronically on imdur.  Mikle Bosworth.

## 2018-07-20 NOTE — Telephone Encounter (Signed)
[id number] 

## 2018-07-20 NOTE — Telephone Encounter (Signed)
PT made aware. Voiced understanding.  

## 2018-08-02 ENCOUNTER — Other Ambulatory Visit: Payer: Self-pay | Admitting: Internal Medicine

## 2018-10-16 ENCOUNTER — Other Ambulatory Visit: Payer: Self-pay | Admitting: Internal Medicine

## 2018-11-06 ENCOUNTER — Other Ambulatory Visit: Payer: Self-pay | Admitting: Internal Medicine

## 2018-11-06 ENCOUNTER — Other Ambulatory Visit: Payer: Self-pay

## 2018-11-06 MED ORDER — AMLODIPINE BESYLATE 10 MG PO TABS: 10.0000 mg | ORAL_TABLET | Freq: Every day | ORAL | 5 refills | Status: DC

## 2018-12-19 ENCOUNTER — Other Ambulatory Visit: Payer: Self-pay

## 2018-12-19 NOTE — Patient Outreach (Signed)
[facility]) Care Management  12/19/2018  [name] Nov 29, 1950 [id number]   Medication Adherence call to [name] left a message for patient to call back.   [facility] Management Direct Dial [phone]  Fax [phone] [email or web address]

## 2018-12-28 ENCOUNTER — Other Ambulatory Visit: Payer: Self-pay

## 2018-12-28 NOTE — Patient Outreach (Signed)
[facility]) Care Management  12/28/2018  [name] 08/10/1951 [id number]   Medication Adherence call to Mr. Timothy Trudell Telephone call to Patient regarding Medication Adherence unable to reach patient HIPPA Compliant Voice message left with a call back number.   [place] Management Direct Dial [phone]  Fax [phone] [email or web address]

## 2019-02-06 ENCOUNTER — Telehealth: Payer: Self-pay

## 2019-02-06 NOTE — Telephone Encounter (Signed)
Patient requests to r/s 1 yr follow up with Dr.Taylor, will message front desk staff

## 2019-02-06 NOTE — Telephone Encounter (Signed)
lmtcb

## 2019-02-08 ENCOUNTER — Ambulatory Visit: Payer: PPO | Admitting: Internal Medicine

## 2019-03-13 ENCOUNTER — Other Ambulatory Visit: Payer: Self-pay | Admitting: Internal Medicine

## 2019-04-06 ENCOUNTER — Other Ambulatory Visit: Payer: Self-pay | Admitting: Internal Medicine

## 2019-05-02 ENCOUNTER — Telehealth (INDEPENDENT_AMBULATORY_CARE_PROVIDER_SITE_OTHER): Payer: PPO | Admitting: Internal Medicine

## 2019-05-02 ENCOUNTER — Other Ambulatory Visit: Payer: Self-pay

## 2019-05-02 VITALS — BP 142/86 | HR 71 | Temp 98.7°F | Wt 140.0 lb

## 2019-05-02 DIAGNOSIS — K469 Unspecified abdominal hernia without obstruction or gangrene: Secondary | ICD-10-CM | POA: Diagnosis not present

## 2019-05-02 DIAGNOSIS — F5221 Male erectile disorder: Secondary | ICD-10-CM

## 2019-05-02 DIAGNOSIS — I119 Hypertensive heart disease without heart failure: Secondary | ICD-10-CM

## 2019-05-02 DIAGNOSIS — I2510 Atherosclerotic heart disease of native coronary artery without angina pectoris: Secondary | ICD-10-CM

## 2019-05-02 MED ORDER — SILDENAFIL CITRATE 20 MG PO TABS: ORAL_TABLET | ORAL | 0 refills | Status: DC

## 2019-05-02 NOTE — Addendum Note (Signed)
Addended by: Levonne Hubert on: 05/02/2019 03:18 PM   Modules accepted: Orders

## 2019-05-02 NOTE — Progress Notes (Signed)
Electrophysiology  Note   Date:  05/02/2019   ID:  [name], DOB 01-May-1951, MRN [id number]  Location: patient's home  Provider location: [address] [place]  Evaluation Performed: Follow-up visit  PCP:  Sinda Du, MD  Cardiologist:  Cristopher Peru, MD  Electrophysiologist:  Dr taylor  Chief Complaint:  "My hernia is bothering me."  History of Present Illness:    [name] is a 68 y.o. male who presents for an in office visit today.  Since last being seen in our clinic, the patient reports doing very well.  Today, he denies symptoms of palpitations, chest pain, shortness of breath,  lower extremity edema, dizziness, presyncope, or syncope.   He has 2 complaints today. The first is ED. He is taking imdur. The second is pain from a right sided hernia. He would like a referral for a general surgeon for possible hernia repair.  The patient denies symptoms of fevers, chills, cough, or new SOB worrisome for COVID 19.  Past Medical History:  Diagnosis Date  . Acute MI ([place]) 08/2012  . CAD (coronary artery disease)    a. 08/2012 Inferolateral STEMI/Cath/PCI: LM nl, LAD minor irregs, RI 20p, LCX nl, OM1 100d (Tx w 2.75x34mm Promus DES), RCA nondom, minor irregs, EF60-65%.  . Chronic bronchitis ([place])   . Environmental allergies   . GERD (gastroesophageal reflux disease)   . History of gout X 1  . History of hiatal hernia   . History of kidney stones   . HTN (hypertension) 03/10/2018  . Hypertension   . NSTEMI (non-ST elevated myocardial infarction) ([place]) 03/08/2018  . S/P angioplasty with stent to mid to distal LCX with DES 03/08/18  03/10/2018  . Tobacco abuse     Past Surgical History:  Procedure Laterality Date  . APPENDECTOMY    . CHOLECYSTECTOMY    . CORONARY STENT INTERVENTION N/A 03/08/2018   Procedure: CORONARY STENT INTERVENTION;  Surgeon: Belva Crome, MD;  Location: [place] [facility];  Service: Cardiovascular;  Laterality: N/A;  . LEFT  HEART CATH AND CORONARY ANGIOGRAPHY N/A 03/08/2018   Procedure: LEFT HEART CATH AND CORONARY ANGIOGRAPHY;  Surgeon: Belva Crome, MD;  Location: [place] [facility];  Service: Cardiovascular;  Laterality: N/A;  . LEFT HEART CATHETERIZATION WITH CORONARY ANGIOGRAM N/A 08/25/2012   Procedure: LEFT HEART CATHETERIZATION WITH CORONARY ANGIOGRAM;  Surgeon: Hillary Bow, MD;  Location: [facility] CATH LAB;  Service: Cardiovascular;  Laterality: N/A;  . PERCUTANEOUS CORONARY STENT INTERVENTION (PCI-S) Left 08/25/2012   Procedure: PERCUTANEOUS CORONARY STENT INTERVENTION (PCI-S);  Surgeon: Hillary Bow, MD;  Location: [facility] CATH LAB;  Service: Cardiovascular;  Laterality: Left;  stent x 1 om    Current Outpatient Medications  Medication Sig Dispense Refill  . albuterol (PROVENTIL HFA;VENTOLIN HFA) 108 (90 Base) MCG/ACT inhaler Inhale 2 puffs into the lungs every 6 (six) hours as needed for wheezing or shortness of breath.    Marland Kitchen amLODipine (NORVASC) 10 MG tablet Take 1 tablet (10 mg total) by mouth daily. 30 tablet 5  . aspirin EC 81 MG EC tablet Take 1 tablet (81 mg total) by mouth daily. (Patient taking differently: Take 81 mg by mouth at bedtime. )    . atorvastatin (LIPITOR) 80 MG tablet Take 1 tablet (80 mg total) by mouth daily at 6 PM. 30 tablet 6  . carvedilol (COREG) 6.25 MG tablet TAKE ONE TABLET (6.25MG  TOTAL) BY MOUTH TWO TIMES DAILY 180 tablet 3  .  clopidogrel (PLAVIX) 75 MG tablet TAKE ONE (1) TABLET BY MOUTH EVERY DAY 90 tablet 2  . hydrochlorothiazide (HYDRODIURIL) 25 MG tablet Take 0.5 tablets (12.5 mg total) by mouth daily. 16 tablet 6  . HYDROcodone-acetaminophen (NORCO/VICODIN) 5-325 MG tablet Take 2 tablets by mouth every 4 (four) hours as needed. 6 tablet 0  . isosorbide mononitrate (IMDUR) 30 MG 24 hr tablet TAKE 1 TABLET (30 MG TOTAL) BY MOUTH DAILY 30 tablet 6  . losartan (COZAAR) 50 MG tablet TAKE ONE TABLET BY MOUTH TWICE A DAY 180 tablet 0  . nitroGLYCERIN (NITROSTAT) 0.4 MG SL  tablet PLACE ONE TABLET UNDER THE TONGUE EVERY FIVE MINUTES FOR THREE DOSES AS NEEDED FOR CHEST PAIN 25 tablet 4   No current facility-administered medications for this visit.     Allergies:   Sulfa antibiotics   Social History:  The patient  reports that he has been smoking cigarettes. He started smoking about 51 years ago. He has a 37.50 pack-year smoking history. He has never used smokeless tobacco. He reports that he does not drink alcohol or use drugs.   Family History:  The patient's  family history includes Hypertension in his father and mother; Stroke in his father.   ROS:  Please see the history of present illness.   All other systems are personally reviewed and negative.    Exam:    Vital Signs: BP - 142/86, P - 72/min Gen: well appearing NAD Ext - no edema Lungs - no increased work of breathing Neuro - non-focal.  Labs/Other Tests and Data Reviewed:    Recent Labs: No results found for requested labs within last 8760 hours.   Wt Readings from Last 3 Encounters:  06/15/18 133 lb (60.3 kg)  03/16/18 137 lb 9.6 oz (62.4 kg)  03/10/18 141 lb 1.5 oz (64 kg)     Other studies personally reviewed:   ASSESSMENT & PLAN:    1.  CAD - he is s/p remote NSTEMI. No anginal symptoms. 2. HTN - his blood pressure has been good. 3. ED - he has requested viagra. I will give him a prescription for sildenafil once he brings in his imdur and his nitroglycerin. I reminded him that he may not take viagra/sidenafil if he is taking imdur or nitroglycerin. 4. Preoperative eval - he will be referred to [facility] for eval. He is low risk for cardiac complications from surgery. He may proceed if deemed necessary. 5. COVID 19 screen The patient denies symptoms of COVID 19 at this time.  The importance of social distancing was discussed today.  Follow-up:  6 months Next remote: n/a  Current medicines are reviewed at length with the patient today.   The patient does not have concerns regarding  his medicines.  The following changes were made today:  none  Labs/ tests ordered today include: none No orders of the defined types were placed in this encounter.    Patient Risk:  after full review of this patients clinical status, I feel that they are at moderate risk at this time.  Today, I have spent 25 minutes with the patient with the patient discussing all of the above .    Signed, Cristopher Peru, MD  05/02/2019 2:31 PM     [facility] [address] [place] [address] [phone] (office) [phone] (fax)

## 2019-05-02 NOTE — Patient Instructions (Signed)
Medication Instructions:  Your physician has recommended you make the following change in your medication:  Stop taking Nitroglycerin and Imdur  If you need a refill on your cardiac medications before your next appointment, please call your pharmacy.   Lab work: NONE   If you have labs (blood work) drawn today and your tests are completely normal, you will receive your results only by: Marland Kitchen MyChart Message (if you have MyChart) OR . A paper copy in the mail If you have any lab test that is abnormal or we need to change your treatment, we will call you to review the results.  Testing/Procedures: You have been referred to Dr. Reynaldo Minium, MD or Greer Pickerel, MD    Follow-Up: At [facility], you and your health needs are our priority.  As part of our continuing mission to provide you with exceptional heart care, we have created designated Provider Care Teams.  These Care Teams include your primary Cardiologist (physician) and Advanced Practice Providers (APPs -  Physician Assistants and Nurse Practitioners) who all work together to provide you with the care you need, when you need it. You will need a follow up appointment in 6 months.  Please call our office 2 months in advance to schedule this appointment.  You may see Dr.Taylor or one of the following Advanced Practice Providers on your designated Care Team:   Chanetta Marshall, NP . Tommye Standard, PA-C  Any Other Special Instructions Will Be Listed Below (If Applicable). Thank you for choosing [facility]!

## 2019-05-02 NOTE — Addendum Note (Signed)
Addended by: Levonne Hubert on: 05/02/2019 03:53 PM   Modules accepted: Orders

## 2019-05-12 ENCOUNTER — Other Ambulatory Visit: Payer: Self-pay | Admitting: Internal Medicine

## 2019-05-17 ENCOUNTER — Other Ambulatory Visit: Payer: Self-pay | Admitting: Internal Medicine

## 2019-06-01 ENCOUNTER — Ambulatory Visit: Payer: Self-pay | Admitting: Surgery

## 2019-06-18 ENCOUNTER — Other Ambulatory Visit: Payer: Self-pay

## 2019-08-01 ENCOUNTER — Other Ambulatory Visit: Payer: Self-pay | Admitting: Internal Medicine

## 2019-08-16 ENCOUNTER — Other Ambulatory Visit: Payer: Self-pay | Admitting: Internal Medicine

## 2019-09-11 ENCOUNTER — Telehealth: Payer: Self-pay | Admitting: Internal Medicine

## 2019-09-11 NOTE — Telephone Encounter (Signed)
Asking for information about doctor he was referred to by Dr Lovena Le

## 2019-09-12 NOTE — Telephone Encounter (Signed)
Pt calling to get the name and number to Dr. Georgette Dover.

## 2019-09-24 ENCOUNTER — Ambulatory Visit: Payer: Self-pay | Admitting: Surgery

## 2019-09-24 DIAGNOSIS — K4090 Unilateral inguinal hernia, without obstruction or gangrene, not specified as recurrent: Secondary | ICD-10-CM | POA: Diagnosis not present

## 2019-09-24 NOTE — H&P (View-Only) (Signed)
History of Present Illness Imogene Burn. Kyarah Enamorado MD; 09/24/2019 10:41 AM) The patient is a 68 year old male who presents with an inguinal hernia. Referred by Dr. Cristopher Peru for [facility] PCP - Dr. Luan Pulling  This is a 69 year old male with coronary artery disease status post stents on Plavix and aspirin who presents with at least a year history of an enlarging right inguinal hernia. The patient has been wearing a truss that keeps it reduced but he feels that the hernia is enlarging. It is becoming more uncomfortable. It remains reducible. He denies any obstructive symptoms. He mentioned this to his cardiologist who then referred him to [place] for evaluation. He is accompanied by cardiac clearance.   Problem List/Past Medical Rodman Key K. Can Lucci, MD; 09/24/2019 10:41 AM) INGUINAL HERNIA OF RIGHT SIDE WITHOUT OBSTRUCTION OR GANGRENE (K40.90)  Diagnostic Studies History Mammie Lorenzo, LPN; QA348G 075-GRM AM) Colonoscopy 5-10 years ago  Allergies Mammie Lorenzo, LPN; QA348G [id number] AM) Sulfa Drugs Hives.  Medication History Mammie Lorenzo, LPN; QA348G [id number] AM) amLODIPine Besylate (10MG  Tablet, Oral) Active. Carvedilol (6.25MG  Tablet, Oral) Active. Clopidogrel Bisulfate (75MG  Tablet, Oral) Active. Losartan Potassium (50MG  Tablet, Oral) Active. Aspirin (81MG  Tablet, Oral) Active. cloNIDine HCl (0.1MG  Tablet, Oral) Active. Albuterol (90MCG/ACT Aerosol Soln, Inhalation) Active. Atorvastatin Calcium (80MG  Tablet, Oral) Active. Sildenafil Citrate (20MG  Tablet, Oral) Active. Medications Reconciled  Social History Mammie Lorenzo, LPN; QA348G 075-GRM AM) Caffeine use Coffee. Tobacco use Current some day smoker.  Family History Mammie Lorenzo, LPN; QA348G 075-GRM AM) Cerebrovascular Accident Father. Diabetes Mellitus Father. Heart disease in male family member before age 26 Hypertension Father, Mother.  Other Problems Imogene Burn. Danessa Mensch, MD; 09/24/2019 10:41 AM) Congestive Heart  Failure High blood pressure Myocardial infarction     Review of Systems Claiborne Billings Dockery LPN; QA348G 075-GRM AM) General Not Present- Appetite Loss, Chills, Fatigue, Fever, Night Sweats, Weight Gain and Weight Loss. Skin Not Present- Change in Wart/Mole, Dryness, Hives, Jaundice, New Lesions, Non-Healing Wounds, Rash and Ulcer. HEENT Not Present- Earache, Hearing Loss, Hoarseness, Nose Bleed, Oral Ulcers, Ringing in the Ears, Seasonal Allergies, Sinus Pain, Sore Throat, Visual Disturbances, Wears glasses/contact lenses and Yellow Eyes. Respiratory Not Present- Bloody sputum, Chronic Cough, Difficulty Breathing, Snoring and Wheezing. Breast Not Present- Breast Mass, Breast Pain, Nipple Discharge and Skin Changes. Cardiovascular Not Present- Chest Pain, Difficulty Breathing Lying Down, Leg Cramps, Palpitations, Rapid Heart Rate, Shortness of Breath and Swelling of Extremities. Gastrointestinal Not Present- Abdominal Pain, Bloating, Bloody Stool, Change in Bowel Habits, Chronic diarrhea, Constipation, Difficulty Swallowing, Excessive gas, Gets full quickly at meals, Hemorrhoids, Indigestion, Nausea, Rectal Pain and Vomiting. Male Genitourinary Not Present- Frequency, Nocturia, Painful Urination, Pelvic Pain and Urgency. Musculoskeletal Not Present- Back Pain, Joint Pain, Joint Stiffness, Muscle Pain, Muscle Weakness and Swelling of Extremities. Neurological Not Present- Decreased Memory, Fainting, Headaches, Numbness, Seizures, Tingling, Tremor, Trouble walking and Weakness. Psychiatric Not Present- Anxiety, Bipolar, Change in Sleep Pattern, Depression, Fearful and Frequent crying. Endocrine Not Present- Cold Intolerance, Excessive Hunger, Hair Changes, Heat Intolerance, Hot flashes and New Diabetes. Hematology Present- Blood Thinners. Not Present- Easy Bruising, Excessive bleeding, Gland problems, HIV and Persistent Infections.  Vitals Claiborne Billings Dockery LPN; QA348G [id number] AM) 09/24/2019 9:50  AM Weight: 142.6 lb Height: 67.5in Body Surface Area: 1.76 m Body Mass Index: 22 kg/m  Temp.: 98.73F(Thermal Scan)  Pulse: 67 (Regular)  BP: 124/80 (Sitting, Left Arm, Standard)        Physical Exam Rodman Key K. Alegra Rost MD; 09/24/2019 10:43 AM)  The physical exam findings are as follows: Note:WDWN  in NAD Eyes: Pupils equal, round; sclera anicteric HENT: Oral mucosa moist; good dentition Neck: No masses palpated, no thyromegaly Lungs: CTA bilaterally; normal respiratory effort CV: Regular rate and rhythm; no murmurs; extremities well-perfused with no edema Abd: +bowel sounds, soft, non-tender, no palpable organomegaly; GU: surgically absent right testicle; descended left testicle without masses or tenderness; large right inguinal hernia - reducible; no left inguinal hernia Skin: Warm, dry; no sign of jaundice Psychiatric - alert and oriented x 4; calm mood and affect    Assessment & Plan Rodman Key K. Justyna Timoney MD; 09/24/2019 10:07 AM)  INGUINAL HERNIA OF RIGHT SIDE WITHOUT OBSTRUCTION OR GANGRENE (K40.90)  Current Plans Schedule for Surgery - Right inguinal hernia repair with mesh. The surgical procedure has been discussed with the patient. Potential risks, benefits, alternative treatments, and expected outcomes have been explained. All of the patient's questions at this time have been answered. The likelihood of reaching the patient's treatment goal is good. The patient understand the proposed surgical procedure and wishes to proceed.  Hold ASA/ Plavix 5 days before surgery  Imogene Burn. Georgette Dover, MD, [facility] Surgery  General/ Trauma Surgery   09/24/2019 10:44 AM

## 2019-09-24 NOTE — H&P (Signed)
History of Present Illness [name]. Timothy Heward MD; 09/24/2019 10:41 AM) The patient is a 68 year old male who presents with an inguinal hernia. Referred by Dr. Cristopher Peru for [facility] PCP - Dr. Luan Pulling  This is a 68 year old male with coronary artery disease status post stents on Plavix and aspirin who presents with at least a year history of an enlarging right inguinal hernia. The patient has been wearing a truss that keeps it reduced but he feels that the hernia is enlarging. It is becoming more uncomfortable. It remains reducible. He denies any obstructive symptoms. He mentioned this to his cardiologist who then referred him to [place] for evaluation. He is accompanied by cardiac clearance.   Problem List/Past Medical Rodman Key K. Ravan Schlemmer, MD; 09/24/2019 10:41 AM) INGUINAL HERNIA OF RIGHT SIDE WITHOUT OBSTRUCTION OR GANGRENE (K40.90)  Diagnostic Studies History Mammie Lorenzo, LPN; QA348G 075-GRM AM) Colonoscopy 5-10 years ago  Allergies Mammie Lorenzo, LPN; QA348G [id number] AM) Sulfa Drugs Hives.  Medication History Mammie Lorenzo, LPN; QA348G [id number] AM) amLODIPine Besylate (10MG  Tablet, Oral) Active. Carvedilol (6.25MG  Tablet, Oral) Active. Clopidogrel Bisulfate (75MG  Tablet, Oral) Active. Losartan Potassium (50MG  Tablet, Oral) Active. Aspirin (81MG  Tablet, Oral) Active. cloNIDine HCl (0.1MG  Tablet, Oral) Active. Albuterol (90MCG/ACT Aerosol Soln, Inhalation) Active. Atorvastatin Calcium (80MG  Tablet, Oral) Active. Sildenafil Citrate (20MG  Tablet, Oral) Active. Medications Reconciled  Social History Mammie Lorenzo, LPN; QA348G 075-GRM AM) Caffeine use Coffee. Tobacco use Current some day smoker.  Family History Mammie Lorenzo, LPN; QA348G 075-GRM AM) Cerebrovascular Accident Father. Diabetes Mellitus Father. Heart disease in male family member before age 67 Hypertension Father, Mother.  Other Problems [name]. Evon Lopezperez, MD; 09/24/2019 10:41 AM) Congestive Heart  Failure High blood pressure Myocardial infarction     Review of Systems Claiborne Billings Dockery LPN; QA348G 075-GRM AM) General Not Present- Appetite Loss, Chills, Fatigue, Fever, Night Sweats, Weight Gain and Weight Loss. Skin Not Present- Change in Wart/Mole, Dryness, Hives, Jaundice, New Lesions, Non-Healing Wounds, Rash and Ulcer. HEENT Not Present- Earache, Hearing Loss, Hoarseness, Nose Bleed, Oral Ulcers, Ringing in the Ears, Seasonal Allergies, Sinus Pain, Sore Throat, Visual Disturbances, Wears glasses/contact lenses and Yellow Eyes. Respiratory Not Present- Bloody sputum, Chronic Cough, Difficulty Breathing, Snoring and Wheezing. Breast Not Present- Breast Mass, Breast Pain, Nipple Discharge and Skin Changes. Cardiovascular Not Present- Chest Pain, Difficulty Breathing Lying Down, Leg Cramps, Palpitations, Rapid Heart Rate, Shortness of Breath and Swelling of Extremities. Gastrointestinal Not Present- Abdominal Pain, Bloating, Bloody Stool, Change in Bowel Habits, Chronic diarrhea, Constipation, Difficulty Swallowing, Excessive gas, Gets full quickly at meals, Hemorrhoids, Indigestion, Nausea, Rectal Pain and Vomiting. Male Genitourinary Not Present- Frequency, Nocturia, Painful Urination, Pelvic Pain and Urgency. Musculoskeletal Not Present- Back Pain, Joint Pain, Joint Stiffness, Muscle Pain, Muscle Weakness and Swelling of Extremities. Neurological Not Present- Decreased Memory, Fainting, Headaches, Numbness, Seizures, Tingling, Tremor, Trouble walking and Weakness. Psychiatric Not Present- Anxiety, Bipolar, Change in Sleep Pattern, Depression, Fearful and Frequent crying. Endocrine Not Present- Cold Intolerance, Excessive Hunger, Hair Changes, Heat Intolerance, Hot flashes and New Diabetes. Hematology Present- Blood Thinners. Not Present- Easy Bruising, Excessive bleeding, Gland problems, HIV and Persistent Infections.  Vitals Claiborne Billings Dockery LPN; QA348G [id number] AM) 09/24/2019 9:50  AM Weight: 142.6 lb Height: 67.5in Body Surface Area: 1.76 m Body Mass Index: 22 kg/m  Temp.: 98.54F(Thermal Scan)  Pulse: 67 (Regular)  BP: 124/80 (Sitting, Left Arm, Standard)        Physical Exam Rodman Key K. British Moyd MD; 09/24/2019 10:43 AM)  The physical exam findings are as follows: Note:WDWN  in NAD Eyes: Pupils equal, round; sclera anicteric HENT: Oral mucosa moist; good dentition Neck: No masses palpated, no thyromegaly Lungs: CTA bilaterally; normal respiratory effort CV: Regular rate and rhythm; no murmurs; extremities well-perfused with no edema Abd: +bowel sounds, soft, non-tender, no palpable organomegaly; GU: surgically absent right testicle; descended left testicle without masses or tenderness; large right inguinal hernia - reducible; no left inguinal hernia Skin: Warm, dry; no sign of jaundice Psychiatric - alert and oriented x 4; calm mood and affect    Assessment & Plan Rodman Key K. Deontra Pereyra MD; 09/24/2019 10:07 AM)  INGUINAL HERNIA OF RIGHT SIDE WITHOUT OBSTRUCTION OR GANGRENE (K40.90)  Current Plans Schedule for Surgery - Right inguinal hernia repair with mesh. The surgical procedure has been discussed with the patient. Potential risks, benefits, alternative treatments, and expected outcomes have been explained. All of the patient's questions at this time have been answered. The likelihood of reaching the patient's treatment goal is good. The patient understand the proposed surgical procedure and wishes to proceed.  Hold ASA/ Plavix 5 days before surgery  [name]. Georgette Dover, MD, [facility] Surgery  General/ Trauma Surgery   09/24/2019 10:44 AM

## 2019-10-13 ENCOUNTER — Other Ambulatory Visit (HOSPITAL_COMMUNITY)
Admission: RE | Admit: 2019-10-13 | Discharge: 2019-10-13 | Disposition: A | Discharge status: Home / Self Care | Payer: PPO | Source: Ambulatory Visit | Attending: Surgery | Admitting: Surgery

## 2019-10-13 DIAGNOSIS — Z01812 Encounter for preprocedural laboratory examination: Secondary | ICD-10-CM | POA: Insufficient documentation

## 2019-10-13 DIAGNOSIS — Z20828 Contact with and (suspected) exposure to other viral communicable diseases: Secondary | ICD-10-CM | POA: Diagnosis not present

## 2019-10-14 LAB — NOVEL CORONAVIRUS, NAA (HOSP ORDER, SEND-OUT TO REF LAB; TAT 18-24 HRS): SARS-CoV-2, NAA: NOT DETECTED

## 2019-10-16 ENCOUNTER — Other Ambulatory Visit: Payer: Self-pay

## 2019-10-16 ENCOUNTER — Encounter (HOSPITAL_COMMUNITY): Payer: Self-pay | Admitting: *Deleted

## 2019-10-16 NOTE — Progress Notes (Signed)
Pt denies SOB and chest pain. Pt stated that he is under the care of Dr. Lovena Le, Cardiology and Dr. Luan Pulling, PCP. Pt denies having an echo but stated that a stress test was performed > 25 years ago. Pt denies having an EKG and chest x ray in the last year. Pt denies recent labs. Pt stated that last dose of Aspirin and Plavix was 10/12/19 as instructed. Pt made aware to stop taking vitamins, fish oil and herbal medications. Do not take any NSAIDs ie: Ibuprofen, Advil, Naproxen (Aleve), Motrin, BC and Goody Powder. Pt verbalized understanding of all pre-op instructions. PA, Anesthesiology, asked to review pt history; see note.

## 2019-10-16 NOTE — Anesthesia Preprocedure Evaluation (Addendum)
Anesthesia Evaluation  Patient identified by MRN, date of birth, ID band Patient awake  General Assessment Comment:Slow to wake up  Reviewed: Allergy & Precautions, NPO status , Patient's Chart, lab work & pertinent test results  History of Anesthesia Complications (+) history of anesthetic complications  Airway Mallampati: II  TM Distance: >3 FB Neck ROM: Full    Dental  (+) Edentulous Lower, Edentulous Upper, Dental Advisory Given   Pulmonary Current Smoker and Patient abstained from smoking.,    Pulmonary exam normal breath sounds clear to auscultation       Cardiovascular hypertension, Pt. on medications + CAD and + Past MI  Normal cardiovascular exam Rhythm:Regular Rate:Normal  08/2012 Inferolateral STEMI/Cath/PCI: LM nl, LAD minor irregs, RI 20p, LCX nl, OM1 100d (Tx w 2.75x7mm Promus DES), RCA nondom, minor irregs, EF60-65%.  NSTEMI 2019- stent to mid to distal LCX with DES 03/08/18  Cath and PCI 03/08/18: Acute coronary syndrome with non-ST elevation myocardial infarction presentation due to thrombotic 99% stenosis in the mid to distal circumflex proximal to previous distal stent. Left dominant coronary anatomy Normal left main 50% stenosis in the proximal to mid LAD.  Small first diagonal contains 70% ostial narrowing. Right coronary is nondominant and contains both acute marginal and distal disease of 70-85%. Successful PTCA and stent implantation overlapping the previously placed distal stent proximally.  Reduction in 99% stenosis with TIMI grade III flow to 0% with TIMI grade III flow.    Seen by cardiology preop: "Preoperative eval - he will be referred to [facility] for eval. He is low risk for cardiac complications from surgery. He may proceed if deemed necessary."   Neuro/Psych negative neurological ROS  negative psych ROS   GI/Hepatic hiatal hernia, GERD  Medicated and Controlled,  Endo/Other  negative endocrine  ROS  Renal/GU negative Renal ROS  negative genitourinary   Musculoskeletal negative musculoskeletal ROS (+)   Abdominal Normal abdominal exam  (+)   Peds  Hematology negative hematology ROS (+)   Anesthesia Other Findings   Reproductive/Obstetrics negative OB ROS                         Anesthesia Physical Anesthesia Plan  ASA: III  Anesthesia Plan: General   Post-op Pain Management:    Induction: Intravenous  PONV Risk Score and Plan: 3 and Ondansetron, Dexamethasone, Midazolam and Treatment may vary due to age or medical condition  Airway Management Planned: Oral ETT  Additional Equipment: None  Intra-op Plan:   Post-operative Plan: Extubation in OR  Informed Consent: I have reviewed the patients History and Physical, chart, labs and discussed the procedure including the risks, benefits and alternatives for the proposed anesthesia with the patient or authorized representative who has indicated his/her understanding and acceptance.     Dental advisory given  Plan Discussed with: CRNA  Anesthesia Plan Comments:       Anesthesia Quick Evaluation

## 2019-10-16 NOTE — Progress Notes (Signed)
Anesthesia Chart Review: Same day workup  Follows with cardiology for hx of CAD s/p STEMI with DES to [place] 10/13 and NSTEMI with DES to LCX 02/2018. Last seen by Dr. Lovena Le 05/02/19, surgery discussed. Per note "Preoperative eval - he will be referred to [facility] for eval. He is low risk for cardiac complications from surgery. He may proceed if deemed necessary."  Will need DOS labs, EKG, and eval.   EKG 03/16/18: Sinus  Rhythm. Rate 66. Left axis -anterior fascicular block.  Nonspecific T-abnormality.   Cath and PCI 03/08/18:  Acute coronary syndrome with non-ST elevation myocardial infarction presentation due to thrombotic 99% stenosis in the mid to distal circumflex proximal to previous distal stent.  Left dominant coronary anatomy  Normal left main  50% stenosis in the proximal to mid LAD.  Small first diagonal contains 70% ostial narrowing.  Right coronary is nondominant and contains both acute marginal and distal disease of 70-85%.  Successful PTCA and stent implantation overlapping the previously placed distal stent proximally.  Reduction in 99% stenosis with TIMI grade III flow to 0% with TIMI grade III flow.  RECOMMENDATIONS:   Aggressive risk factor modification including smoking cessation and blood pressure control.  Added low-dose diuretic therapy to his regimen.  The combination of clonidine and carvedilol will probably not be sustainable because of bradycardia.  Plavix 225 mg administered in the lab.  Continue chronic Plavix therapy.  Eligible for discharge in a.m. if no complications.  High intensity statin therapy has been added.  [place], [place] [facility]/Anesthesiology Phone [phone] 10/16/2019 1:04 PM

## 2019-10-17 ENCOUNTER — Other Ambulatory Visit: Payer: Self-pay

## 2019-10-17 ENCOUNTER — Encounter (HOSPITAL_COMMUNITY)
Admission: RE | Disposition: A | Discharge status: Home / Self Care | Payer: Self-pay | Source: Home / Self Care | Attending: Surgery

## 2019-10-17 ENCOUNTER — Ambulatory Visit (HOSPITAL_COMMUNITY): Payer: PPO | Admitting: Physician Assistant

## 2019-10-17 ENCOUNTER — Ambulatory Visit (HOSPITAL_COMMUNITY)
Admission: RE | Admit: 2019-10-17 | Discharge: 2019-10-17 | Disposition: A | Discharge status: Home / Self Care | Payer: PPO | Attending: Surgery | Admitting: Surgery

## 2019-10-17 ENCOUNTER — Encounter (HOSPITAL_COMMUNITY): Payer: Self-pay

## 2019-10-17 DIAGNOSIS — F17200 Nicotine dependence, unspecified, uncomplicated: Secondary | ICD-10-CM | POA: Insufficient documentation

## 2019-10-17 DIAGNOSIS — Z7902 Long term (current) use of antithrombotics/antiplatelets: Secondary | ICD-10-CM | POA: Insufficient documentation

## 2019-10-17 DIAGNOSIS — Z955 Presence of coronary angioplasty implant and graft: Secondary | ICD-10-CM | POA: Diagnosis not present

## 2019-10-17 DIAGNOSIS — K219 Gastro-esophageal reflux disease without esophagitis: Secondary | ICD-10-CM | POA: Diagnosis not present

## 2019-10-17 DIAGNOSIS — Z79899 Other long term (current) drug therapy: Secondary | ICD-10-CM | POA: Diagnosis not present

## 2019-10-17 DIAGNOSIS — I10 Essential (primary) hypertension: Secondary | ICD-10-CM | POA: Diagnosis not present

## 2019-10-17 DIAGNOSIS — K449 Diaphragmatic hernia without obstruction or gangrene: Secondary | ICD-10-CM | POA: Diagnosis not present

## 2019-10-17 DIAGNOSIS — K4090 Unilateral inguinal hernia, without obstruction or gangrene, not specified as recurrent: Secondary | ICD-10-CM | POA: Diagnosis not present

## 2019-10-17 DIAGNOSIS — E785 Hyperlipidemia, unspecified: Secondary | ICD-10-CM | POA: Diagnosis not present

## 2019-10-17 DIAGNOSIS — I2510 Atherosclerotic heart disease of native coronary artery without angina pectoris: Secondary | ICD-10-CM | POA: Diagnosis not present

## 2019-10-17 DIAGNOSIS — Z7982 Long term (current) use of aspirin: Secondary | ICD-10-CM | POA: Diagnosis not present

## 2019-10-17 DIAGNOSIS — I252 Old myocardial infarction: Secondary | ICD-10-CM | POA: Insufficient documentation

## 2019-10-17 HISTORY — PX: INGUINAL HERNIA REPAIR: SHX194

## 2019-10-17 HISTORY — DX: Complete loss of teeth, unspecified cause, unspecified class: Z97.2

## 2019-10-17 HISTORY — DX: Pneumonia, unspecified organism: J18.9

## 2019-10-17 HISTORY — DX: Complete loss of teeth, unspecified cause, unspecified class: K08.109

## 2019-10-17 HISTORY — PX: INSERTION OF MESH: SHX5868

## 2019-10-17 HISTORY — DX: Presence of spectacles and contact lenses: Z97.3

## 2019-10-17 HISTORY — DX: Unilateral inguinal hernia, without obstruction or gangrene, not specified as recurrent: K40.90

## 2019-10-17 HISTORY — DX: Other complications of anesthesia, initial encounter: T88.59XA

## 2019-10-17 LAB — CBC
HCT: 48.6 % (ref 39.0–52.0)
Hemoglobin: 15.8 g/dL (ref 13.0–17.0)
MCH: 31.6 pg (ref 26.0–34.0)
MCHC: 32.5 g/dL (ref 30.0–36.0)
MCV: 97.2 fL (ref 80.0–100.0)
Platelets: 219 10*3/uL (ref 150–400)
RBC: 5 MIL/uL (ref 4.22–5.81)
RDW: 13.3 % (ref 11.5–15.5)
WBC: 8.1 10*3/uL (ref 4.0–10.5)
nRBC: 0 % (ref 0.0–0.2)

## 2019-10-17 SURGERY — REPAIR, HERNIA, INGUINAL, ADULT
Anesthesia: General | Site: Inguinal | Laterality: Right

## 2019-10-17 MED ORDER — ACETAMINOPHEN 500 MG PO TABS
ORAL_TABLET | ORAL | Status: AC
  Administered 2019-10-17: 1000 mg via ORAL
  Filled 2019-10-17: qty 2

## 2019-10-17 MED ORDER — FENTANYL CITRATE (PF) 100 MCG/2ML IJ SOLN
INTRAMUSCULAR | Status: AC
  Administered 2019-10-17: 12:00:00 100 ug via INTRAVENOUS
  Filled 2019-10-17: qty 2

## 2019-10-17 MED ORDER — ACETAMINOPHEN 500 MG PO TABS
1000.0000 mg | ORAL_TABLET | ORAL | Status: AC
  Administered 2019-10-17: 12:00:00 1000 mg via ORAL

## 2019-10-17 MED ORDER — CEFAZOLIN SODIUM-DEXTROSE 2-4 GM/100ML-% IV SOLN
INTRAVENOUS | Status: AC
  Filled 2019-10-17: qty 100

## 2019-10-17 MED ORDER — CEFAZOLIN SODIUM-DEXTROSE 2-4 GM/100ML-% IV SOLN
2.0000 g | INTRAVENOUS | Status: AC
  Administered 2019-10-17: 2 g via INTRAVENOUS

## 2019-10-17 MED ORDER — GABAPENTIN 300 MG PO CAPS
300.0000 mg | ORAL_CAPSULE | ORAL | Status: AC
  Administered 2019-10-17: 12:00:00 300 mg via ORAL

## 2019-10-17 MED ORDER — PROPOFOL 10 MG/ML IV BOLUS
INTRAVENOUS | Status: AC
  Filled 2019-10-17: qty 20

## 2019-10-17 MED ORDER — OXYCODONE HCL 5 MG/5ML PO SOLN: 5.0000 mg | Freq: Once | ORAL | Status: DC | PRN

## 2019-10-17 MED ORDER — CHLORHEXIDINE GLUCONATE CLOTH 2 % EX PADS: 6.0000 | MEDICATED_PAD | Freq: Once | CUTANEOUS | Status: DC

## 2019-10-17 MED ORDER — ALBUTEROL SULFATE HFA 108 (90 BASE) MCG/ACT IN AERS
INHALATION_SPRAY | RESPIRATORY_TRACT | Status: DC | PRN
  Administered 2019-10-17: 8 via RESPIRATORY_TRACT

## 2019-10-17 MED ORDER — MIDAZOLAM HCL 2 MG/2ML IJ SOLN
1.0000 mg | Freq: Once | INTRAMUSCULAR | Status: AC
  Administered 2019-10-17: 12:00:00 1 mg via INTRAVENOUS

## 2019-10-17 MED ORDER — PROPOFOL 10 MG/ML IV BOLUS
INTRAVENOUS | Status: DC | PRN
  Administered 2019-10-17: 150 mg via INTRAVENOUS

## 2019-10-17 MED ORDER — FENTANYL CITRATE (PF) 100 MCG/2ML IJ SOLN
100.0000 ug | Freq: Once | INTRAMUSCULAR | Status: AC
  Administered 2019-10-17: 12:00:00 100 ug via INTRAVENOUS

## 2019-10-17 MED ORDER — 0.9 % SODIUM CHLORIDE (POUR BTL) OPTIME
TOPICAL | Status: DC | PRN
  Administered 2019-10-17: 1000 mL

## 2019-10-17 MED ORDER — ONDANSETRON HCL 4 MG/2ML IJ SOLN
INTRAMUSCULAR | Status: AC
  Filled 2019-10-17: qty 2

## 2019-10-17 MED ORDER — ROCURONIUM BROMIDE 10 MG/ML (PF) SYRINGE
PREFILLED_SYRINGE | INTRAVENOUS | Status: AC
  Filled 2019-10-17: qty 10

## 2019-10-17 MED ORDER — MIDAZOLAM HCL 2 MG/2ML IJ SOLN
INTRAMUSCULAR | Status: AC
  Filled 2019-10-17: qty 2

## 2019-10-17 MED ORDER — PHENYLEPHRINE 40 MCG/ML (10ML) SYRINGE FOR IV PUSH (FOR BLOOD PRESSURE SUPPORT)
PREFILLED_SYRINGE | INTRAVENOUS | Status: AC
  Filled 2019-10-17: qty 10

## 2019-10-17 MED ORDER — LACTATED RINGERS IV SOLN
INTRAVENOUS | Status: DC | PRN
  Administered 2019-10-17 (×2): via INTRAVENOUS

## 2019-10-17 MED ORDER — LACTATED RINGERS IV SOLN
INTRAVENOUS | Status: DC
  Administered 2019-10-17: 12:00:00 via INTRAVENOUS

## 2019-10-17 MED ORDER — OXYCODONE HCL 5 MG PO TABS: 5.0000 mg | ORAL_TABLET | Freq: Four times a day (QID) | ORAL | 0 refills | Status: DC | PRN

## 2019-10-17 MED ORDER — BUPIVACAINE-EPINEPHRINE 0.5% -1:200000 IJ SOLN
INTRAMUSCULAR | Status: AC
  Filled 2019-10-17: qty 1

## 2019-10-17 MED ORDER — PHENYLEPHRINE HCL-NACL 10-0.9 MG/250ML-% IV SOLN
INTRAVENOUS | Status: DC | PRN
  Administered 2019-10-17: 30 ug/min via INTRAVENOUS

## 2019-10-17 MED ORDER — FENTANYL CITRATE (PF) 250 MCG/5ML IJ SOLN
INTRAMUSCULAR | Status: AC
  Filled 2019-10-17: qty 5

## 2019-10-17 MED ORDER — ROCURONIUM BROMIDE 10 MG/ML (PF) SYRINGE
PREFILLED_SYRINGE | INTRAVENOUS | Status: DC | PRN
  Administered 2019-10-17: 50 mg via INTRAVENOUS

## 2019-10-17 MED ORDER — ONDANSETRON HCL 4 MG/2ML IJ SOLN
INTRAMUSCULAR | Status: DC | PRN
  Administered 2019-10-17: 4 mg via INTRAVENOUS

## 2019-10-17 MED ORDER — BUPIVACAINE HCL (PF) 0.25 % IJ SOLN
INTRAMUSCULAR | Status: AC
  Filled 2019-10-17: qty 30

## 2019-10-17 MED ORDER — GABAPENTIN 300 MG PO CAPS
ORAL_CAPSULE | ORAL | Status: AC
  Administered 2019-10-17: 300 mg via ORAL
  Filled 2019-10-17: qty 1

## 2019-10-17 MED ORDER — FENTANYL CITRATE (PF) 250 MCG/5ML IJ SOLN
INTRAMUSCULAR | Status: DC | PRN
  Administered 2019-10-17: 100 ug via INTRAVENOUS

## 2019-10-17 MED ORDER — BUPIVACAINE-EPINEPHRINE (PF) 0.5% -1:200000 IJ SOLN
INTRAMUSCULAR | Status: DC | PRN
  Administered 2019-10-17: 10 mL

## 2019-10-17 MED ORDER — LIDOCAINE 2% (20 MG/ML) 5 ML SYRINGE
INTRAMUSCULAR | Status: AC
  Filled 2019-10-17: qty 5

## 2019-10-17 MED ORDER — SUGAMMADEX SODIUM 200 MG/2ML IV SOLN
INTRAVENOUS | Status: DC | PRN
  Administered 2019-10-17: 150 mg via INTRAVENOUS

## 2019-10-17 MED ORDER — EPHEDRINE SULFATE 50 MG/ML IJ SOLN
INTRAMUSCULAR | Status: DC | PRN
  Administered 2019-10-17 (×3): 10 mg via INTRAVENOUS

## 2019-10-17 MED ORDER — MIDAZOLAM HCL 2 MG/2ML IJ SOLN
INTRAMUSCULAR | Status: AC
  Administered 2019-10-17: 12:00:00 1 mg via INTRAVENOUS
  Filled 2019-10-17: qty 2

## 2019-10-17 MED ORDER — HYDROMORPHONE HCL 1 MG/ML IJ SOLN: 0.2500 mg | INTRAMUSCULAR | Status: DC | PRN

## 2019-10-17 MED ORDER — ONDANSETRON HCL 4 MG/2ML IJ SOLN: 4.0000 mg | Freq: Once | INTRAMUSCULAR | Status: DC | PRN

## 2019-10-17 MED ORDER — OXYCODONE HCL 5 MG PO TABS: 5.0000 mg | ORAL_TABLET | Freq: Once | ORAL | Status: DC | PRN

## 2019-10-17 MED ORDER — PHENYLEPHRINE 40 MCG/ML (10ML) SYRINGE FOR IV PUSH (FOR BLOOD PRESSURE SUPPORT)
PREFILLED_SYRINGE | INTRAVENOUS | Status: DC | PRN
  Administered 2019-10-17 (×2): 120 ug via INTRAVENOUS

## 2019-10-17 MED ORDER — ALBUTEROL SULFATE HFA 108 (90 BASE) MCG/ACT IN AERS
INHALATION_SPRAY | RESPIRATORY_TRACT | Status: AC
  Filled 2019-10-17: qty 6.7

## 2019-10-17 MED ORDER — DEXAMETHASONE SODIUM PHOSPHATE 10 MG/ML IJ SOLN
INTRAMUSCULAR | Status: DC | PRN
  Administered 2019-10-17: 10 mg via INTRAVENOUS

## 2019-10-17 MED ORDER — EPHEDRINE 5 MG/ML INJ
INTRAVENOUS | Status: AC
  Filled 2019-10-17: qty 10

## 2019-10-17 MED ORDER — LIDOCAINE 2% (20 MG/ML) 5 ML SYRINGE
INTRAMUSCULAR | Status: DC | PRN
  Administered 2019-10-17: 60 mg via INTRAVENOUS

## 2019-10-17 SURGICAL SUPPLY — 50 items
APL PRP STRL LF DISP 70% ISPRP (MISCELLANEOUS) ×1
APL SKNCLS STERI-STRIP NONHPOA (GAUZE/BANDAGES/DRESSINGS) ×1
BENZOIN TINCTURE PRP APPL 2/3 (GAUZE/BANDAGES/DRESSINGS) ×3 IMPLANT
BLADE CLIPPER SURG (BLADE) IMPLANT
CHLORAPREP W/TINT 26 (MISCELLANEOUS) ×3 IMPLANT
CLOSURE STERI-STRIP 1/2X4 (GAUZE/BANDAGES/DRESSINGS) ×1
CLOSURE WOUND 1/2 X4 (GAUZE/BANDAGES/DRESSINGS) ×1
CLSR STERI-STRIP ANTIMIC 1/2X4 (GAUZE/BANDAGES/DRESSINGS) ×1 IMPLANT
COVER SURGICAL LIGHT HANDLE (MISCELLANEOUS) ×3 IMPLANT
COVER WAND RF STERILE (DRAPES) ×1 IMPLANT
DRAIN PENROSE 1/2X12 LTX STRL (WOUND CARE) ×2 IMPLANT
DRAPE LAPAROSCOPIC ABDOMINAL (DRAPES) IMPLANT
DRAPE LAPAROTOMY TRNSV 102X78 (DRAPES) IMPLANT
DRSG TEGADERM 4X4.75 (GAUZE/BANDAGES/DRESSINGS) ×3 IMPLANT
ELECT CAUTERY BLADE 6.4 (BLADE) IMPLANT
ELECT REM PT RETURN 9FT ADLT (ELECTROSURGICAL) ×3
ELECTRODE REM PT RTRN 9FT ADLT (ELECTROSURGICAL) ×1 IMPLANT
GAUZE 4X4 16PLY RFD (DISPOSABLE) ×3 IMPLANT
GAUZE SPONGE 4X4 12PLY STRL (GAUZE/BANDAGES/DRESSINGS) ×3 IMPLANT
GLOVE BIO SURGEON STRL SZ7 (GLOVE) ×3 IMPLANT
GLOVE BIOGEL PI IND STRL 7.5 (GLOVE) ×1 IMPLANT
GLOVE BIOGEL PI INDICATOR 7.5 (GLOVE) ×2
GOWN STRL REUS W/ TWL LRG LVL3 (GOWN DISPOSABLE) ×2 IMPLANT
GOWN STRL REUS W/TWL LRG LVL3 (GOWN DISPOSABLE) ×6
KIT BASIN OR (CUSTOM PROCEDURE TRAY) ×3 IMPLANT
KIT TURNOVER KIT B (KITS) ×3 IMPLANT
MESH PARIETEX PROGRIP RIGHT (Mesh General) ×2 IMPLANT
NDL HYPO 25GX1X1/2 BEV (NEEDLE) ×1 IMPLANT
NEEDLE HYPO 25GX1X1/2 BEV (NEEDLE) ×3 IMPLANT
NS IRRIG 1000ML POUR BTL (IV SOLUTION) ×3 IMPLANT
PACK GENERAL/GYN (CUSTOM PROCEDURE TRAY) ×3 IMPLANT
PAD ARMBOARD 7.5X6 YLW CONV (MISCELLANEOUS) ×3 IMPLANT
PENCIL SMOKE EVACUATOR (MISCELLANEOUS) ×3 IMPLANT
SPONGE GAUZE 2X2 8PLY STER LF (GAUZE/BANDAGES/DRESSINGS) ×1
SPONGE GAUZE 2X2 8PLY STRL LF (GAUZE/BANDAGES/DRESSINGS) ×1 IMPLANT
SPONGE INTESTINAL PEANUT (DISPOSABLE) ×3 IMPLANT
STRIP CLOSURE SKIN 1/2X4 (GAUZE/BANDAGES/DRESSINGS) ×2 IMPLANT
SUT MNCRL AB 4-0 PS2 18 (SUTURE) ×3 IMPLANT
SUT PDS AB 0 CT 36 (SUTURE) IMPLANT
SUT SILK 2 0 SH (SUTURE) ×2 IMPLANT
SUT SILK 3 0 (SUTURE)
SUT SILK 3-0 18XBRD TIE 12 (SUTURE) IMPLANT
SUT VIC AB 0 CT2 27 (SUTURE) ×3 IMPLANT
SUT VIC AB 2-0 SH 27 (SUTURE) ×3
SUT VIC AB 2-0 SH 27X BRD (SUTURE) ×1 IMPLANT
SUT VIC AB 3-0 SH 27 (SUTURE) ×3
SUT VIC AB 3-0 SH 27XBRD (SUTURE) ×1 IMPLANT
SYR CONTROL 10ML LL (SYRINGE) ×3 IMPLANT
TOWEL GREEN STERILE (TOWEL DISPOSABLE) ×3 IMPLANT
TOWEL GREEN STERILE FF (TOWEL DISPOSABLE) ×3 IMPLANT

## 2019-10-17 NOTE — Anesthesia Postprocedure Evaluation (Signed)
Anesthesia Post Note  Patient: [name]  Procedure(s) Performed: RIGHT INGUINAL HERNIA REPAIR WITH MESH (Right Inguinal) INSERTION OF MESH (Right Inguinal)     Patient location during evaluation: PACU Anesthesia Type: General and Regional Level of consciousness: awake and alert, oriented and patient cooperative Pain management: pain level controlled Vital Signs Assessment: post-procedure vital signs reviewed and stable Respiratory status: spontaneous breathing, nonlabored ventilation and respiratory function stable Cardiovascular status: blood pressure returned to baseline and stable Postop Assessment: no apparent nausea or vomiting Anesthetic complications: no    Last Vitals:  Vitals:   10/17/19 1415 10/17/19 1445  BP: 111/85 102/74  Pulse: (!) 45 (!) 44  Resp: 14 15  Temp:    SpO2: 98% 97%    Last Pain:  Vitals:   10/17/19 1400  TempSrc:   PainSc: [place]

## 2019-10-17 NOTE — Interval H&P Note (Signed)
History and Physical Interval Note:  10/17/2019 10:40 AM  [name]  has presented today for surgery, with the diagnosis of RIGHT INGUINAL HERNIA.  The various methods of treatment have been discussed with the patient and family. After consideration of risks, benefits and other options for treatment, the patient has consented to  Procedure(s) with comments: RIGHT INGUINAL HERNIA REPAIR WITH MESH (Right) - LMA TAP BLOCK as a surgical intervention.  The patient's history has been reviewed, patient examined, no change in status, stable for surgery.  I have reviewed the patient's chart and labs.  Questions were answered to the patient's satisfaction.     Maia Petties

## 2019-10-17 NOTE — Discharge Instructions (Signed)
[facility] _______[facility] ° °UMBILICAL OR INGUINAL HERNIA REPAIR: POST OP INSTRUCTIONS ° °Always review your discharge instruction sheet given to you by the facility where your surgery was performed. °IF YOU HAVE DISABILITY OR FAMILY LEAVE FORMS, YOU MUST BRING THEM TO THE OFFICE FOR PROCESSING.   °DO NOT GIVE THEM TO YOUR DOCTOR. ° °1. A  prescription for pain medication may be given to you upon discharge.  Take your pain medication as prescribed, if needed.  If narcotic pain medicine is not needed, then you may take acetaminophen (Tylenol) or ibuprofen (Advil) as needed. °2. Take your usually prescribed medications unless otherwise directed. °If you need a refill on your pain medication, please contact your pharmacy.  They will contact our office to request authorization. Prescriptions will not be filled after 5 pm or on week-ends. °3. You should follow a light diet the first 24 hours after arrival home, such as soup and crackers, etc.  Be sure to include lots of fluids daily.  Resume your normal diet the day after surgery. °4.Most patients will experience some swelling and bruising around the umbilicus or in the groin and scrotum.  Ice packs and reclining will help.  Swelling and bruising can take several days to resolve.  °6. It is common to experience some constipation if taking pain medication after surgery.  Increasing fluid intake and taking a stool softener (such as Colace) will usually help or prevent this problem from occurring.  A mild laxative (Milk of Magnesia or Miralax) should be taken according to package directions if there are no bowel movements after 48 hours. °7. Unless discharge instructions indicate otherwise, you may remove your bandages 24-48 hours after surgery, and you may shower at that time.  You may have steri-strips (small skin tapes) in place directly over the incision.  These strips should be left on the skin for 7-10 days.  If your surgeon used skin glue on the  incision, you may shower in 24 hours.  The glue will flake off over the next 2-3 weeks.  Any sutures or staples will be removed at the office during your follow-up visit. °8. ACTIVITIES:  You may resume regular (light) daily activities beginning the next day--such as daily self-care, walking, climbing stairs--gradually increasing activities as tolerated.  You may have sexual intercourse when it is comfortable.  Refrain from any heavy lifting or straining until approved by your doctor. ° °a.You may drive when you are no longer taking prescription pain medication, you can comfortably wear a seatbelt, and you can safely maneuver your car and apply brakes. °b.RETURN TO WORK:   °_____________________________________________ ° °9.You should see your doctor in the office for a follow-up appointment approximately 2-3 weeks after your surgery.  Make sure that you call for this appointment within a day or two after you arrive home to insure a convenient appointment time. °10.OTHER INSTRUCTIONS: _________________________ °   _____________________________________ ° °WHEN TO CALL YOUR DOCTOR: °1. Fever over 101.0 °2. Inability to urinate °3. Nausea and/or vomiting °4. Extreme swelling or bruising °5. Continued bleeding from incision. °6. Increased pain, redness, or drainage from the incision ° °The clinic staff is available to answer your questions during regular business hours.  Please don’t hesitate to call and ask to speak to one of the nurses for clinical concerns.  If you have a medical emergency, go to the nearest emergency room or call 911.  A surgeon from [facility] is always on call at the hospital ° ° °  [address], [address], [place], [place]  [address] ? ° [address] [address], [place],    [address] °[phone] ? [phone] ? FAX [phone] °Web site: [email or web address] °

## 2019-10-17 NOTE — Anesthesia Procedure Notes (Signed)
Procedure Name: Intubation Date/Time: 10/17/2019 12:53 PM Performed by: Harden Mo, CRNA Pre-anesthesia Checklist: Patient identified, Emergency Drugs available, Suction available and Patient being monitored Patient Re-evaluated:Patient Re-evaluated prior to induction Oxygen Delivery Method: Circle System Utilized Preoxygenation: Pre-oxygenation with 100% oxygen Induction Type: IV induction Ventilation: Mask ventilation without difficulty and Oral airway inserted - appropriate to patient size Laryngoscope Size: Sabra Heck and 2 Grade View: Grade I Tube type: Oral Tube size: 7.5 mm Number of attempts: 1 Airway Equipment and Method: Stylet and Oral airway Placement Confirmation: ETT inserted through vocal cords under direct vision,  positive ETCO2 and breath sounds checked- equal and bilateral Secured at: 23 cm Tube secured with: Tape Dental Injury: Teeth and Oropharynx as per pre-operative assessment

## 2019-10-17 NOTE — Transfer of Care (Signed)
Immediate Anesthesia Transfer of Care Note  Patient: [name]  Procedure(s) Performed: RIGHT INGUINAL HERNIA REPAIR WITH MESH (Right Inguinal) INSERTION OF MESH (Right Inguinal)  Patient Location: PACU  Anesthesia Type:General and Regional  Level of Consciousness: awake and alert   Airway & Oxygen Therapy: Patient Spontanous Breathing and Patient connected to nasal cannula oxygen  Post-op Assessment: Report given to RN, Post -op Vital signs reviewed and stable and Patient moving all extremities X 4  Post vital signs: Reviewed and stable  Last Vitals:  Vitals Value Taken Time  BP 110/75 10/17/19 1358  Temp    Pulse 42 10/17/19 1358  Resp 11 10/17/19 1358  SpO2 100 % 10/17/19 1358  Vitals shown include unvalidated device data.  Last Pain:  Vitals:   10/17/19 1129  TempSrc:   PainSc: 0-No pain      Patients Stated Pain Goal: 4 ([id number])  Complications: No apparent anesthesia complications

## 2019-10-17 NOTE — Op Note (Signed)
Hernia, Open, Procedure Note  Indications: This is a 68 year old male with coronary artery disease status post stents on Plavix and aspirin who presents with at least a year history of an enlarging right inguinal hernia. The patient has been wearing a truss that keeps it reduced but he feels that the hernia is enlarging. It is becoming more uncomfortable. It remains reducible. He denies any obstructive symptoms. He mentioned this to his cardiologist who then referred him to [place] for evaluation. He is accompanied by cardiac clearance.  Pre-operative Diagnosis: right reducible inguinal hernia Post-operative Diagnosis: same  Surgeon: Maia Petties   Assistants: Carlena Hurl PA-C  Anesthesia: General LMA anesthesia and TAP block  ASA Class: 2  Procedure Details  The patient was seen again in the Holding Room. The risks, benefits, complications, treatment options, and expected outcomes were discussed with the patient. The possibilities of reaction to medication, pulmonary aspiration, perforation of viscus, bleeding, recurrent infection, the need for additional procedures, and development of a complication requiring transfusion or further operation were discussed with the patient and/or family. The likelihood of success in repairing the hernia and returning the patient to their previous functional status is good.  There was concurrence with the proposed plan, and informed consent was obtained. The site of surgery was properly noted/marked. The patient was taken to the Operating Room, identified as ORLO HOPP, and the procedure verified as right inguinal hernia repair. A Time Out was held and the above information confirmed.  The patient was placed in the supine position and underwent induction of anesthesia. The lower abdomen and groin was prepped with Chloraprep and draped in the standard fashion, and 0.25% Marcaine with epinephrine was used to anesthetize the skin over the mid-portion of the  inguinal canal. An oblique incision was made. Dissection was carried down through the subcutaneous tissue with cautery to the external oblique fascia.  We opened the external oblique fascia along the direction of its fibers to the external ring.  There is an obvious split in the external oblique fascia. The spermatic cord was circumferentially dissected bluntly and retracted with a Penrose drain.  The ilioinguinal nerve was identified and preserved.  The floor of the inguinal canal was inspected and lax, but intact.  We skeletonized the spermatic cord and reduced a long indirect hernia sac.  Some of the sac was excised and we ligated the neck of the hernia sac with 2-0 silk.  The internal ring was tightened with 0 Vicryl after reducing the stump of the hernia sac.  We used a right Progrip mesh which was inserted and deployed across the floor of the inguinal canal. The mesh was tucked underneath the external oblique fascia laterally.  The flap of the mesh was closed around the spermatic cord to recreate the internal inguinal ring.  The mesh was secured to the pubic tubercle with 0 Vicryl.  Additional stay sutures were placed in the lower edge of the mesh. The external oblique fascia was reapproximated with 2-0 Vicryl.  3-0 Vicryl was used to close the subcutaneous tissues and 4-0 Monocryl was used to close the skin in subcuticular fashion.  Benzoin and steri-strips were used to seal the incision.  A clean dressing was applied.  The patient was then extubated and brought to the recovery room in stable condition.  All sponge, instrument, and needle counts were correct prior to closure and at the conclusion of the case.   Estimated Blood Loss: Minimal  Complications: None; patient tolerated the procedure well.         Disposition: PACU - hemodynamically stable.         Condition: stable  Imogene Burn. Georgette Dover, MD, [facility] Surgery  General/ Trauma Surgery   10/17/2019 1:40  PM

## 2019-10-18 ENCOUNTER — Encounter (HOSPITAL_COMMUNITY): Payer: Self-pay | Admitting: Surgery

## 2019-10-18 LAB — SURGICAL PATHOLOGY

## 2019-12-04 ENCOUNTER — Other Ambulatory Visit: Payer: Self-pay

## 2019-12-04 MED ORDER — CLOPIDOGREL BISULFATE 75 MG PO TABS: 75.0000 mg | ORAL_TABLET | Freq: Every day | ORAL | 1 refills | Status: DC

## 2019-12-04 NOTE — Telephone Encounter (Signed)
Refilled plavix per fax request 

## 2019-12-10 ENCOUNTER — Other Ambulatory Visit: Payer: Self-pay | Admitting: Internal Medicine

## 2020-03-10 ENCOUNTER — Other Ambulatory Visit: Payer: Self-pay | Admitting: Internal Medicine

## 2020-04-12 ENCOUNTER — Other Ambulatory Visit: Payer: Self-pay | Admitting: Internal Medicine

## 2020-04-28 ENCOUNTER — Other Ambulatory Visit: Payer: Self-pay | Admitting: Internal Medicine

## 2020-06-06 ENCOUNTER — Other Ambulatory Visit: Payer: Self-pay | Admitting: Internal Medicine

## 2020-06-08 ENCOUNTER — Other Ambulatory Visit: Payer: Self-pay | Admitting: Internal Medicine

## 2020-06-15 ENCOUNTER — Other Ambulatory Visit: Payer: Self-pay | Admitting: Cardiology

## 2020-06-20 ENCOUNTER — Other Ambulatory Visit: Payer: Self-pay | Admitting: Internal Medicine

## 2020-06-20 NOTE — Telephone Encounter (Signed)
This is a [place] pt.  °

## 2020-06-23 ENCOUNTER — Other Ambulatory Visit: Payer: Self-pay | Admitting: Internal Medicine

## 2020-06-23 NOTE — Telephone Encounter (Signed)
This is a [place] pt.  °

## 2020-07-22 ENCOUNTER — Telehealth: Payer: Self-pay | Admitting: Internal Medicine

## 2020-07-22 NOTE — Telephone Encounter (Signed)
New message    Patient stopped in office would like a call back he has questions about his blood thinner    Pt c/o medication issue:  1. Name of Medication: plavix  2. How are you currently taking this medication (dosage and times per day)? As prescribed   3. Are you having a reaction (difficulty breathing--STAT)? yes  4. What is your medication issue? Patient is having blood after a bowel movement- stool is not bloody just when he wipes

## 2020-07-22 NOTE — Telephone Encounter (Signed)
Spoke with pt who denies discomfort when have a BM, but does feel that the blood is coming from hemorrids. There is not blood in stool just as he cleans himself. Please advise.

## 2020-07-23 ENCOUNTER — Other Ambulatory Visit: Payer: Self-pay | Admitting: Internal Medicine

## 2020-07-23 NOTE — Telephone Encounter (Signed)
Pt needs to keep upcoming appt scheduled with Dr. Lovena Le in October 2021 for further refills.

## 2020-07-23 NOTE — Telephone Encounter (Signed)
Pt's pharmacy is requesting a refill on sildenafil. Would Dr. Taylor like to refill this medication? Please address 

## 2020-07-24 NOTE — Telephone Encounter (Signed)
Please ask him to call his primary MD. He will need to have his GI tract evaluated.

## 2020-07-25 NOTE — Telephone Encounter (Signed)
Pt notified and voiced understanding 

## 2020-09-03 ENCOUNTER — Other Ambulatory Visit: Payer: Self-pay

## 2020-09-03 ENCOUNTER — Ambulatory Visit: Payer: Medicare HMO | Admitting: Internal Medicine

## 2020-09-03 ENCOUNTER — Encounter: Payer: Self-pay | Admitting: Internal Medicine

## 2020-09-03 ENCOUNTER — Telehealth: Payer: Self-pay | Admitting: Internal Medicine

## 2020-09-03 VITALS — BP 122/88 | HR 62 | Ht 67.0 in | Wt 147.0 lb

## 2020-09-03 DIAGNOSIS — I2510 Atherosclerotic heart disease of native coronary artery without angina pectoris: Secondary | ICD-10-CM | POA: Diagnosis not present

## 2020-09-03 DIAGNOSIS — I119 Hypertensive heart disease without heart failure: Secondary | ICD-10-CM | POA: Diagnosis not present

## 2020-09-03 MED ORDER — SILDENAFIL CITRATE 20 MG PO TABS: ORAL_TABLET | ORAL | 5 refills | Status: DC

## 2020-09-03 NOTE — Telephone Encounter (Signed)
Call back number is not in service. Prescription [facility] today. I placed call to mobile number listed for patient to discuss/clarify.  Left message to call office.

## 2020-09-03 NOTE — Telephone Encounter (Signed)
Awaits approval of prior Auth

## 2020-09-03 NOTE — Patient Instructions (Signed)
Medication Instructions:  Your physician recommends that you continue on your current medications as directed. Please refer to the Current Medication list given to you today.  *If you need a refill on your cardiac medications before your next appointment, please call your pharmacy*   Lab Work: NONE   If you have labs (blood work) drawn today and your tests are completely normal, you will receive your results only by: . MyChart Message (if you have MyChart) OR . A paper copy in the mail If you have any lab test that is abnormal or we need to change your treatment, we will call you to review the results.   Testing/Procedures: NONE    Follow-Up: At [facility], you and your health needs are our priority.  As part of our continuing mission to provide you with exceptional heart care, we have created designated Provider Care Teams.  These Care Teams include your primary Cardiologist (physician) and Advanced Practice Providers (APPs -  Physician Assistants and Nurse Practitioners) who all work together to provide you with the care you need, when you need it.  We recommend signing up for the patient portal called "MyChart".  Sign up information is provided on this After Visit Summary.  MyChart is used to connect with patients for Virtual Visits (Telemedicine).  Patients are able to view lab/test results, encounter notes, upcoming appointments, etc.  Non-urgent messages can be sent to your provider as well.   To learn more about what you can do with MyChart, go to [email or web address].    Your next appointment:   1 year(s)  The format for your next appointment:   In Person  Provider:   Gregg Taylor, MD   Other Instructions Thank you for choosing [facility]!    

## 2020-09-03 NOTE — Progress Notes (Signed)
HPI [name] returns today for followup. He is a pleasant 69 yo man with HTN, tobacco abuse, and sinus node dysfunction, CAD., s/p PCI. He denies chest pain or sob. No edema. He has undergone hernia surgery and done well. He has not been wheezing.  Allergies  Allergen Reactions  . Sulfa Antibiotics Hives     Current Outpatient Medications  Medication Sig Dispense Refill  . acetaminophen (TYLENOL) 500 MG tablet Take 500 mg by mouth every 6 (six) hours as needed for moderate pain or headache.    Marland Kitchen amLODipine (NORVASC) 10 MG tablet Take 1 tablet (10 mg total) by mouth daily. 90 tablet 1  . aspirin EC 81 MG EC tablet Take 1 tablet (81 mg total) by mouth daily.    Marland Kitchen atorvastatin (LIPITOR) 80 MG tablet Take 1 tablet (80 mg total) by mouth daily at 6 PM. (Patient taking differently: Take 80 mg by mouth daily. ) 30 tablet 6  . carvedilol (COREG) 6.25 MG tablet Take 1 tablet (6.25 mg total) by mouth 2 (two) times daily. 180 tablet 1  . cloNIDine (CATAPRES) 0.1 MG tablet Take 0.1 mg by mouth 2 (two) times daily.    . clopidogrel (PLAVIX) 75 MG tablet TAKE 1 TABLET BY MOUTH EVERY DAY 90 tablet 1  . diphenhydrAMINE (BENADRYL) 25 mg capsule Take 25 mg by mouth daily as needed (drainage).    Marland Kitchen losartan (COZAAR) 50 MG tablet TAKE 1 TABLET BY MOUTH 2 TIMES DAILY. NEED AN APPT 15 tablet 0  . oxyCODONE (OXY IR/ROXICODONE) 5 MG immediate release tablet Take 1 tablet (5 mg total) by mouth every 6 (six) hours as needed for severe pain. 20 tablet 0  . sildenafil (REVATIO) 20 MG tablet TAKE 2 TO 3 TABLETS BY MOUTH DAILY AS NEEDED 30 tablet 0   No current facility-administered medications for this visit.     Past Medical History:  Diagnosis Date  . Acute MI ([employer]) 08/2012  . CAD (coronary artery disease)    a. 08/2012 Inferolateral STEMI/Cath/PCI: LM nl, LAD minor irregs, RI 20p, LCX nl, OM1 100d (Tx w 2.75x26mm Promus DES), RCA nondom, minor irregs, EF60-65%.  . Chronic bronchitis ([place])   .  Complication of anesthesia    difficulty waking up  . Environmental allergies   . Full dentures   . GERD (gastroesophageal reflux disease)   . History of gout X 1  . History of hiatal hernia   . History of kidney stones   . HTN (hypertension) 03/10/2018  . Hypertension   . Inguinal hernia    right   . NSTEMI (non-ST elevated myocardial infarction) ([place]) 03/08/2018  . Pneumonia    as a baby  . S/P angioplasty with stent to mid to distal LCX with DES 03/08/18  03/10/2018  . Tobacco abuse   . Wears glasses     ROS:   All systems reviewed and negative except as noted in the HPI.   Past Surgical History:  Procedure Laterality Date  . APPENDECTOMY    . CHOLECYSTECTOMY    . COLONOSCOPY W/ BIOPSIES AND POLYPECTOMY    . CORONARY STENT INTERVENTION N/A 03/08/2018   Procedure: CORONARY STENT INTERVENTION;  Surgeon: Belva Crome, MD;  Location: [place] [facility];  Service: Cardiovascular;  Laterality: N/A;  . INGUINAL HERNIA REPAIR Right 10/17/2019   Procedure: RIGHT INGUINAL HERNIA REPAIR WITH MESH;  Surgeon: Donnie Mesa, MD;  Location: [place];  Service: General;  Laterality: Right;  LMA TAP  BLOCK  . INSERTION OF MESH Right 10/17/2019   Procedure: INSERTION OF MESH;  Surgeon: Donnie Mesa, MD;  Location: [place];  Service: General;  Laterality: Right;  . LEFT HEART CATH AND CORONARY ANGIOGRAPHY N/A 03/08/2018   Procedure: LEFT HEART CATH AND CORONARY ANGIOGRAPHY;  Surgeon: Belva Crome, MD;  Location: [place] [facility];  Service: Cardiovascular;  Laterality: N/A;  . LEFT HEART CATHETERIZATION WITH CORONARY ANGIOGRAM N/A 08/25/2012   Procedure: LEFT HEART CATHETERIZATION WITH CORONARY ANGIOGRAM;  Surgeon: Hillary Bow, MD;  Location: [facility] CATH LAB;  Service: Cardiovascular;  Laterality: N/A;  . MULTIPLE TOOTH EXTRACTIONS    . PERCUTANEOUS CORONARY STENT INTERVENTION (PCI-S) Left 08/25/2012   Procedure: PERCUTANEOUS CORONARY STENT INTERVENTION (PCI-S);  Surgeon: Hillary Bow, MD;   Location[facility] C[facility] LAB;  Service: Cardiovascular;  Laterality: Left;  stent x 1 om     Family History  Problem Relation Age of Onset  . Hypertension Mother   . Hypertension Father   . Stroke Father      Social History   Socioeconomic History  . Marital status: Legally Separated    Spouse name: Not on file  . Number of children: Not on file  . Years of education: Not on file  . Highest education level: Not on file  Occupational History  . Not on file  Tobacco Use  . Smoking status: Current Every Day Smoker    Packs/day: 0.75    Years: 50.00    Pack years: 37.50    Types: Cigarettes    Start date: 03/14/1968  . Smokeless tobacco: Never Used  Vaping Use  . Vaping Use: Never used  Substance and Sexual Activity  . Alcohol use: No    Alcohol/week: 0.0 standard drinks  . Drug use: No  . Sexual activity: Yes  Other Topics Concern  . Not on file  Social History Narrative  . Not on file   Social Determinants of Health   Financial Resource Strain:   . Difficulty of Paying Living Expenses: Not on file  Food Insecurity:   . Worried About Charity fundraiser in the Last Year: Not on file  . Ran Out of Food in the Last Year: Not on file  Transportation Needs:   . Lack of Transportation (Medical): Not on file  . Lack of Transportation (Non-Medical): Not on file  Physical Activity:   . Days of Exercise per Week: Not on file  . Minutes of Exercise per Session: Not on file  Stress:   . Feeling of Stress : Not on file  Social Connections:   . Frequency of Communication with Friends and Family: Not on file  . Frequency of Social Gatherings with Friends and Family: Not on file  . Attends Religious Services: Not on file  . Active Member of Clubs or Organizations: Not on file  . Attends Archivist Meetings: Not on file  . Marital Status: Not on file  Intimate Partner Violence:   . Fear of Current or Ex-Partner: Not on file  . Emotionally Abused: Not on file  .  Physically Abused: Not on file  . Sexually Abused: Not on file     BP 122/88   Pulse 62   Ht 5\' 7"  (1.702 m)   Wt 147 lb (66.7 kg)   SpO2 98%   BMI 23.02 kg/m   Physical Exam:  Well appearing 69 yo man, NAD HEENT: Unremarkable Neck:  No JVD, no thyromegally Lymphatics:  No adenopathy  Back:  No CVA tenderness Lungs:  Clear with no wheezes HEART:  Regular rate rhythm, no murmurs, no rubs, no clicks Abd:  soft, positive bowel sounds, no organomegally, no rebound, no guarding Ext:  2 plus pulses, no edema, no cyanosis, no clubbing Skin:  No rashes no nodules Neuro:  CN II through XII intact, motor grossly intact  Assess/Plan: 1. CAD - he is s/p PCI. He denies anginal symptoms. 2. HTN - his bp is much better. Continue. 3. Tobacco abuse - he is encouraged to stop smoking 4. Sinus node dysfunction - he is asymptomatic. No change. No indication for PPM at this time.  Carleene Overlie Jamaury Gumz,MD

## 2020-09-03 NOTE — Telephone Encounter (Signed)
Patient called requesting that the medication sildenafil (REVATIO) 20 MG tablet  Be called into [facility].

## 2020-09-08 ENCOUNTER — Other Ambulatory Visit: Payer: Self-pay | Admitting: Internal Medicine

## 2020-09-08 ENCOUNTER — Telehealth: Payer: Self-pay | Admitting: Pharmacist

## 2020-09-08 NOTE — Telephone Encounter (Signed)
Called pt to let him know PA for sildenafil was denied. Insurance will not pay for it for ED. He will need to pay cash for medication as his insurance will not cover Left VM for pt to call back. He can get a [facility] coupon [facility] or [facility] for 13.42- [facility] is [facility]8.05

## 2020-09-08 NOTE — Telephone Encounter (Signed)
Spoke to patient, he has been paying cash for sildenafil all along.

## 2020-09-22 ENCOUNTER — Other Ambulatory Visit: Payer: Self-pay | Admitting: Internal Medicine

## 2020-10-22 ENCOUNTER — Other Ambulatory Visit: Payer: Self-pay | Admitting: Internal Medicine

## 2020-10-22 NOTE — Telephone Encounter (Signed)
This is a [place] pt.  °

## 2020-12-15 ENCOUNTER — Other Ambulatory Visit: Payer: Self-pay | Admitting: Internal Medicine

## 2020-12-15 ENCOUNTER — Other Ambulatory Visit: Payer: Self-pay | Admitting: Cardiology

## 2020-12-15 NOTE — Telephone Encounter (Signed)
This is a [place] pt.  °

## 2021-02-16 ENCOUNTER — Other Ambulatory Visit: Payer: Self-pay | Admitting: Internal Medicine

## 2021-02-16 DIAGNOSIS — I119 Hypertensive heart disease without heart failure: Secondary | ICD-10-CM

## 2021-04-30 ENCOUNTER — Other Ambulatory Visit: Payer: Self-pay | Admitting: Internal Medicine

## 2021-06-17 ENCOUNTER — Other Ambulatory Visit: Payer: Self-pay | Admitting: Internal Medicine

## 2021-06-17 DIAGNOSIS — I119 Hypertensive heart disease without heart failure: Secondary | ICD-10-CM

## 2021-11-14 ENCOUNTER — Other Ambulatory Visit: Payer: Self-pay | Admitting: Internal Medicine

## 2021-11-14 DIAGNOSIS — I119 Hypertensive heart disease without heart failure: Secondary | ICD-10-CM

## 2021-11-19 ENCOUNTER — Ambulatory Visit: Payer: No Typology Code available for payment source | Admitting: Internal Medicine

## 2021-11-19 ENCOUNTER — Encounter: Payer: Self-pay | Admitting: Internal Medicine

## 2021-11-19 ENCOUNTER — Other Ambulatory Visit: Payer: Self-pay

## 2021-11-19 VITALS — BP 134/86 | HR 67 | Ht 67.0 in | Wt 146.7 lb

## 2021-11-19 DIAGNOSIS — I10 Essential (primary) hypertension: Secondary | ICD-10-CM | POA: Diagnosis not present

## 2021-11-19 NOTE — Progress Notes (Signed)
HPI Mr. Stavely returns today for followup. He is a pleasant 71 yo man with HTN, tobacco abuse, and sinus node dysfunction, CAD., s/p PCI. He denies chest pain or sob. No edema. He has undergone hernia surgery and done well. He has not been wheezing but does note problems with his trigeminal neuralgia and his bp is better. He is still smoking about a pack a day. Allergies  Allergen Reactions   Sulfa Antibiotics Hives     Current Outpatient Medications  Medication Sig Dispense Refill   acetaminophen (TYLENOL) 500 MG tablet Take 500 mg by mouth every 6 (six) hours as needed for moderate pain or headache.     amLODipine (NORVASC) 10 MG tablet TAKE 1 TABLET BY MOUTH EVERY DAY 90 tablet 1   aspirin EC 81 MG EC tablet Take 1 tablet (81 mg total) by mouth daily.     atorvastatin (LIPITOR) 80 MG tablet Take 1 tablet (80 mg total) by mouth daily at 6 PM. (Patient taking differently: Take 80 mg by mouth daily.) 30 tablet 6   carvedilol (COREG) 6.25 MG tablet TAKE 1 TABLET BY MOUTH TWICE A DAY 180 tablet 3   cloNIDine (CATAPRES) 0.1 MG tablet Take 0.1 mg by mouth 2 (two) times daily.     clopidogrel (PLAVIX) 75 MG tablet TAKE 1 TABLET BY MOUTH EVERY DAY 90 tablet 3   diphenhydrAMINE (BENADRYL) 25 mg capsule Take 25 mg by mouth daily as needed (drainage).     losartan (COZAAR) 50 MG tablet Take 1 tablet (50 mg total) by mouth daily. Pt. needs to make an appt. With Dr. Acie Fredrickson in order to receive future refills. Thank You. 1st Attempt. 30 tablet 0   oxyCODONE (OXY IR/ROXICODONE) 5 MG immediate release tablet Take 1 tablet (5 mg total) by mouth every 6 (six) hours as needed for severe pain. 20 tablet 0   sildenafil (REVATIO) 20 MG tablet TAKE 2 TO 3 TABLETS BY MOUTH DAILY AS NEEDED 30 tablet 5   No current facility-administered medications for this visit.     Past Medical History:  Diagnosis Date   Acute MI ([place]) 08/2012   CAD (coronary artery disease)    a. 08/2012 Inferolateral  STEMI/Cath/PCI: LM nl, LAD minor irregs, RI 20p, LCX nl, OM1 100d (Tx w 2.75x79mm Promus DES), RCA nondom, minor irregs, EF60-65%.   Chronic bronchitis ([facility])    Complication of anesthesia    difficulty waking up   Environmental allergies    Full dentures    GERD (gastroesophageal reflux disease)    History of gout X 1   History of hiatal hernia    History of kidney stones    HTN (hypertension) 03/10/2018   Hypertension    Inguinal hernia    right    NSTEMI (non-ST elevated myocardial infarction) ([place]) 03/08/2018   Pneumonia    as a baby   S/P angioplasty with stent to mid to distal LCX with DES 03/08/18  03/10/2018   Tobacco abuse    Wears glasses     ROS:   All systems reviewed and negative except as noted in the HPI.   Past Surgical History:  Procedure Laterality Date   APPENDECTOMY     CHOLECYSTECTOMY     COLONOSCOPY W/ BIOPSIES AND POLYPECTOMY     CORONARY STENT INTERVENTION N/A 03/08/2018   Procedure: CORONARY STENT INTERVENTION;  Surgeon: Belva Crome, MD;  Location: [place] [facility];  Service: Cardiovascular;  Laterality: N/A;   INGUINAL  HERNIA REPAIR Right 10/17/2019   Procedure: RIGHT INGUINAL HERNIA REPAIR WITH MESH;  Surgeon: Donnie Mesa, MD;  Location: [place];  Service: General;  Laterality: Right;  LMA TAP BLOCK   INSERTION OF MESH Right 10/17/2019   Procedure: INSERTION OF MESH;  Surgeon: Donnie Mesa, MD;  Location: [place];  Service: General;  Laterality: Right;   LEFT HEART CATH AND CORONARY ANGIOGRAPHY N/A 03/08/2018   Procedure: LEFT HEART CATH AND CORONARY ANGIOGRAPHY;  Surgeon: Belva Crome, MD;  Location: [place] [facility];  Service: Cardiovascular;  Laterality: N/A;   LEFT HEART CATHETERIZATION WITH CORONARY ANGIOGRAM N/A 08/25/2012   Procedure: LEFT HEART CATHETERIZATION WITH CORONARY ANGIOGRAM;  Surgeon: Hillary Bow, MD;  Location: [facility] CATH LAB;  Service: Cardiovascular;  Laterality: N/A;   MULTIPLE TOOTH EXTRACTIONS     PERCUTANEOUS CORONARY  STENT INTERVENTION (PCI-S) Left 08/25/2012   Procedure: PERCUTANEOUS CORONARY STENT INTERVENTION (PCI-S);  Surgeon: Hillary Bow, MD;  Location: [facility] CATH LAB;  Service: Cardiovascular;  Laterality: Left;  stent x 1 om     Family History  Problem Relation Age of Onset   Hypertension Mother    Hypertension Father    Stroke Father      Social History   Socioeconomic History   Marital status: Legally Separated    Spouse name: Not on file   Number of children: Not on file   Years of education: Not on file   Highest education level: Not on file  Occupational History   Not on file  Tobacco Use   Smoking status: Every Day    Packs/day: 0.75    Years: 50.00    Pack years: 37.50    Types: Cigarettes    Start date: 03/14/1968   Smokeless tobacco: Never  Vaping Use   Vaping Use: Never used  Substance and Sexual Activity   Alcohol use: No    Alcohol/week: 0.0 standard drinks   Drug use: No   Sexual activity: Yes  Other Topics Concern   Not on file  Social History Narrative   Not on file   Social Determinants of Health   Financial Resource Strain: Not on file  Food Insecurity: Not on file  Transportation Needs: Not on file  Physical Activity: Not on file  Stress: Not on file  Social Connections: Not on file  Intimate Partner Violence: Not on file     BP 134/86    Pulse 67    Ht 5\' 7"  (1.702 m)    Wt 146 lb 11.2 oz (66.5 kg)    SpO2 96%    BMI 22.98 kg/m   Physical Exam:  Well appearing NAD HEENT: Unremarkable Neck:  No JVD, no thyromegally Lymphatics:  No adenopathy Back:  No CVA tenderness Lungs:  Clear with no wheezes HEART:  Regular rate rhythm, no murmurs, no rubs, no clicks Abd:  soft, positive bowel sounds, no organomegally, no rebound, no guarding Ext:  2 plus pulses, no edema, no cyanosis, no clubbing Skin:  No rashes no nodules Neuro:  CN II through XII intact, motor grossly intact  EKG - nsr with septal MI  Assess/Plan:  1. CAD - he is s/p PCI.  He denies anginal symptoms. 2. HTN - his bp is much better. Continue. 3. Tobacco abuse - he is encouraged to stop smoking 4. Sinus node dysfunction - he is asymptomatic. No change. No indication for PPM at this time.   Carleene Overlie Kanden Carey,MD

## 2021-11-19 NOTE — Patient Instructions (Signed)
Medication Instructions:  Your physician recommends that you continue on your current medications as directed. Please refer to the Current Medication list given to you today.  *If you need a refill on your cardiac medications before your next appointment, please call your pharmacy*   Lab Work: NONE   If you have labs (blood work) drawn today and your tests are completely normal, you will receive your results only by: . MyChart Message (if you have MyChart) OR . A paper copy in the mail If you have any lab test that is abnormal or we need to change your treatment, we will call you to review the results.   Testing/Procedures: NONE    Follow-Up: At [facility], you and your health needs are our priority.  As part of our continuing mission to provide you with exceptional heart care, we have created designated Provider Care Teams.  These Care Teams include your primary Cardiologist (physician) and Advanced Practice Providers (APPs -  Physician Assistants and Nurse Practitioners) who all work together to provide you with the care you need, when you need it.  We recommend signing up for the patient portal called "MyChart".  Sign up information is provided on this After Visit Summary.  MyChart is used to connect with patients for Virtual Visits (Telemedicine).  Patients are able to view lab/test results, encounter notes, upcoming appointments, etc.  Non-urgent messages can be sent to your provider as well.   To learn more about what you can do with MyChart, go to [email or web address].    Your next appointment:   1 year(s)  The format for your next appointment:   In Person  Provider:   Gregg Taylor, MD   Other Instructions Thank you for choosing [facility]!    

## 2021-12-09 ENCOUNTER — Other Ambulatory Visit: Payer: Self-pay | Admitting: Internal Medicine

## 2021-12-09 DIAGNOSIS — I119 Hypertensive heart disease without heart failure: Secondary | ICD-10-CM

## 2021-12-11 ENCOUNTER — Other Ambulatory Visit: Payer: Self-pay | Admitting: Cardiology

## 2021-12-11 ENCOUNTER — Other Ambulatory Visit: Payer: Self-pay | Admitting: Internal Medicine

## 2021-12-18 DIAGNOSIS — H524 Presbyopia: Secondary | ICD-10-CM | POA: Diagnosis not present

## 2021-12-27 ENCOUNTER — Other Ambulatory Visit: Payer: Self-pay | Admitting: Internal Medicine

## 2022-02-08 ENCOUNTER — Other Ambulatory Visit: Payer: Self-pay | Admitting: Internal Medicine

## 2022-03-22 DIAGNOSIS — I10 Essential (primary) hypertension: Secondary | ICD-10-CM | POA: Diagnosis not present

## 2022-03-22 DIAGNOSIS — R7301 Impaired fasting glucose: Secondary | ICD-10-CM | POA: Diagnosis not present

## 2022-03-24 DIAGNOSIS — I252 Old myocardial infarction: Secondary | ICD-10-CM | POA: Diagnosis not present

## 2022-03-24 DIAGNOSIS — E785 Hyperlipidemia, unspecified: Secondary | ICD-10-CM | POA: Diagnosis not present

## 2022-03-24 DIAGNOSIS — F17200 Nicotine dependence, unspecified, uncomplicated: Secondary | ICD-10-CM | POA: Diagnosis not present

## 2022-03-24 DIAGNOSIS — G500 Trigeminal neuralgia: Secondary | ICD-10-CM | POA: Diagnosis not present

## 2022-03-24 DIAGNOSIS — R7303 Prediabetes: Secondary | ICD-10-CM | POA: Diagnosis not present

## 2022-03-24 DIAGNOSIS — F5221 Male erectile disorder: Secondary | ICD-10-CM | POA: Diagnosis not present

## 2022-03-24 DIAGNOSIS — I10 Essential (primary) hypertension: Secondary | ICD-10-CM | POA: Diagnosis not present

## 2022-03-24 DIAGNOSIS — Z125 Encounter for screening for malignant neoplasm of prostate: Secondary | ICD-10-CM | POA: Diagnosis not present

## 2022-04-14 DIAGNOSIS — G500 Trigeminal neuralgia: Secondary | ICD-10-CM | POA: Diagnosis not present

## 2022-04-14 DIAGNOSIS — R062 Wheezing: Secondary | ICD-10-CM | POA: Diagnosis not present

## 2022-07-03 ENCOUNTER — Other Ambulatory Visit: Payer: Self-pay | Admitting: Internal Medicine

## 2022-07-14 DIAGNOSIS — G500 Trigeminal neuralgia: Secondary | ICD-10-CM | POA: Diagnosis not present

## 2022-08-23 DIAGNOSIS — R0600 Dyspnea, unspecified: Secondary | ICD-10-CM | POA: Diagnosis not present

## 2022-08-23 DIAGNOSIS — J069 Acute upper respiratory infection, unspecified: Secondary | ICD-10-CM | POA: Diagnosis not present

## 2022-09-21 DIAGNOSIS — R7303 Prediabetes: Secondary | ICD-10-CM | POA: Diagnosis not present

## 2022-09-21 DIAGNOSIS — E785 Hyperlipidemia, unspecified: Secondary | ICD-10-CM | POA: Diagnosis not present

## 2022-09-21 DIAGNOSIS — Z125 Encounter for screening for malignant neoplasm of prostate: Secondary | ICD-10-CM | POA: Diagnosis not present

## 2022-12-01 ENCOUNTER — Encounter: Payer: Self-pay | Admitting: Internal Medicine

## 2022-12-01 ENCOUNTER — Ambulatory Visit: Payer: No Typology Code available for payment source | Attending: Internal Medicine | Admitting: Internal Medicine

## 2022-12-01 VITALS — BP 122/82 | HR 63 | Ht 67.0 in | Wt 136.6 lb

## 2022-12-01 DIAGNOSIS — I10 Essential (primary) hypertension: Secondary | ICD-10-CM

## 2022-12-01 NOTE — Patient Instructions (Signed)
Medication Instructions:  Your physician recommends that you continue on your current medications as directed. Please refer to the Current Medication list given to you today.  *If you need a refill on your cardiac medications before your next appointment, please call your pharmacy*   Lab Work: NONE   If you have labs (blood work) drawn today and your tests are completely normal, you will receive your results only by: [place] (if you have MyChart) OR A paper copy in the mail If you have any lab test that is abnormal or we need to change your treatment, we will call you to review the results.   Testing/Procedures: NONE    Follow-Up: At [facility], you and your health needs are our priority.  As part of our continuing mission to provide you with exceptional heart care, we have created designated Provider Care Teams.  These Care Teams include your primary Cardiologist (physician) and Advanced Practice Providers (APPs -  Physician Assistants and Nurse Practitioners) who all work together to provide you with the care you need, when you need it.  We recommend signing up for the patient portal called "MyChart".  Sign up information is provided on this After Visit Summary.  MyChart is used to connect with patients for Virtual Visits (Telemedicine).  Patients are able to view lab/test results, encounter notes, upcoming appointments, etc.  Non-urgent messages can be sent to your provider as well.   To learn more about what you can do with MyChart, go to [email or web address].    Your next appointment:   2 year(s)  Provider:   You may see Cristopher Peru, MD or one of the following Advanced Practice Providers on your designated Care Team:   Bernerd Pho, PA-C  Ermalinda Barrios, PA-C     Other Instructions Thank you for choosing [facility]!

## 2022-12-01 NOTE — Progress Notes (Signed)
HPI [name] returns today for followup. He is a pleasant 72 yo man with HTN, tobacco abuse, and sinus node dysfunction, CAD., s/p PCI. He denies chest pain or sob. No edema.  His trigeminal neuralgia and his bp are better. He is still smoking but less than a halp pack a day.  Allergies  Allergen Reactions   Sulfa Antibiotics Hives     Current Outpatient Medications  Medication Sig Dispense Refill   acetaminophen (TYLENOL) 500 MG tablet Take 500 mg by mouth every 6 (six) hours as needed for moderate pain or headache.     amLODipine (NORVASC) 10 MG tablet TAKE 1 TABLET BY MOUTH EVERY DAY 90 tablet 1   aspirin EC 81 MG EC tablet Take 1 tablet (81 mg total) by mouth daily.     atorvastatin (LIPITOR) 80 MG tablet Take 1 tablet (80 mg total) by mouth daily at 6 PM. (Patient taking differently: Take 80 mg by mouth daily.) 30 tablet 6   carvedilol (COREG) 6.25 MG tablet TAKE 1 TABLET BY MOUTH TWICE A DAY 180 tablet 3   cloNIDine (CATAPRES) 0.1 MG tablet Take 0.1 mg by mouth 2 (two) times daily.     clopidogrel (PLAVIX) 75 MG tablet TAKE 1 TABLET BY MOUTH EVERY DAY 90 tablet 3   diphenhydrAMINE (BENADRYL) 25 mg capsule Take 25 mg by mouth daily as needed (drainage).     losartan (COZAAR) 50 MG tablet Take 1 tablet (50 mg total) by mouth daily. 30 tablet 11   oxyCODONE (OXY IR/ROXICODONE) 5 MG immediate release tablet Take 1 tablet (5 mg total) by mouth every 6 (six) hours as needed for severe pain. 20 tablet 0   sildenafil (REVATIO) 20 MG tablet TAKE 2 TO 3 TABLETS BY MOUTH DAILY AS NEEDED 30 tablet 5   No current facility-administered medications for this visit.     Past Medical History:  Diagnosis Date   Acute MI ([place]) 08/2012   CAD (coronary artery disease)    a. 08/2012 Inferolateral STEMI/Cath/PCI: LM nl, LAD minor irregs, RI 20p, LCX nl, OM1 100d (Tx w 2.75x6m Promus DES), RCA nondom, minor irregs, EF60-65%.   Chronic bronchitis (HCC)    Complication of anesthesia     difficulty waking up   Environmental allergies    Full dentures    GERD (gastroesophageal reflux disease)    History of gout X 1   History of hiatal hernia    History of kidney stones    HTN (hypertension) 03/10/2018   Hypertension    Inguinal hernia    right    [place]   Pneumonia    as a baby   S/P angioplasty with stent to mid to distal LCX with DES 03/08/18  03/10/2018   Tobacco abuse    Wears glasses     ROS:   All systems reviewed and negative except as noted in the HPI.   Past Surgical History:  Procedure Laterality Date   APPENDECTOMY     CHOLECYSTECTOMY     COLONOSCOPY W/ BIOPSIES AND POLYPECTOMY     CORONARY STENT INTERVENTION N/A 03/08/2018   Procedure: CORONARY STENT INTERVENTION;  Surgeon: SBelva Crome MD;  [facility];  Service: Cardiovascular;  Laterality: N/A;   INGUINAL HERNIA REPAIR Right 10/17/2019   Procedure: RIGHT INGUINAL HERNIA REPAIR WITH MESH;  Surgeon: TDonnie Mesa MD;  [facility]  Service: General;  Laterality: Right;  LMA TAP BLOCK  INSERTION OF MESH Right 10/17/2019   Procedure: INSERTION OF MESH;  Surgeon: Donnie Mesa, MD;  Location: [place];  Service: General;  Laterality: Right;   LEFT HEART CATH AND CORONARY ANGIOGRAPHY N/A 03/08/2018   Procedure: LEFT HEART CATH AND CORONARY ANGIOGRAPHY;  Surgeon: Belva Crome, MD;  Location: [place] [facility];  Service: Cardiovascular;  Laterality: N/A;   LEFT HEART CATHETERIZATION WITH CORONARY ANGIOGRAM N/A 08/25/2012   Procedure: LEFT HEART CATHETERIZATION WITH CORONARY ANGIOGRAM;  Surgeon: Hillary Bow, MD;  Location: [facility] CATH LAB;  Service: Cardiovascular;  Laterality: N/A;   MULTIPLE TOOTH EXTRACTIONS     PERCUTANEOUS CORONARY STENT INTERVENTION (PCI-S) Left 08/25/2012   Procedure: PERCUTANEOUS CORONARY STENT INTERVENTION (PCI-S);  Surgeon: Hillary Bow, MD;  Location: [facility] CATH LAB;  Service: Cardiovascular;   Laterality: Left;  stent x 1 om     Family History  Problem Relation Age of Onset   Hypertension Mother    Hypertension Father    Stroke Father      Social History   Socioeconomic History   Marital status: Legally Separated    Spouse name: Not on file   Number of children: Not on file   Years of education: Not on file   Highest education level: Not on file  Occupational History   Not on file  Tobacco Use   Smoking status: Every Day    Packs/day: 0.75    Years: 50.00    Total pack years: 37.50    Types: Cigarettes    Start date: 03/14/1968   Smokeless tobacco: Never  Vaping Use   Vaping Use: Never used  Substance and Sexual Activity   Alcohol use: No    Alcohol/week: 0.0 standard drinks of alcohol   Drug use: No   Sexual activity: Yes  Other Topics Concern   Not on file  Social History Narrative   Not on file   Social Determinants of Health   Financial Resource Strain: Not on file  Food Insecurity: Not on file  Transportation Needs: Not on file  Physical Activity: Not on file  Stress: Not on file  Social Connections: Not on file  Intimate Partner Violence: Not on file     BP 122/82   Pulse 63   Ht '5\' 7"'$  (1.702 m)   Wt 136 lb 9.6 oz (62 kg)   SpO2 95%   BMI 21.39 kg/m   Physical Exam:  Well appearing NAD HEENT: Unremarkable Neck:  No JVD, no thyromegally Lymphatics:  No adenopathy Back:  No CVA tenderness Lungs:  Clear HEART:  Regular rate rhythm, no murmurs, no rubs, no clicks Abd:  soft, positive bowel sounds, no organomegally, no rebound, no guarding Ext:  2 plus pulses, no edema, no cyanosis, no clubbing Skin:  No rashes no nodules Neuro:  CN II through XII intact, motor grossly intact  EKG - NSR with old septal infarct  Assess/Plan: 1. CAD - he is s/p PCI. He denies anginal symptoms. 2. HTN - his bp is much better. Continue. 3. Tobacco abuse - he is encouraged to stop smoking 4. Sinus node dysfunction - he is asymptomatic. No change.  No indication for PPM at this time.   Carleene Overlie Shaneen Reeser,MD

## 2022-12-15 ENCOUNTER — Other Ambulatory Visit: Payer: Self-pay | Admitting: Internal Medicine

## 2022-12-15 DIAGNOSIS — I119 Hypertensive heart disease without heart failure: Secondary | ICD-10-CM

## 2023-02-05 ENCOUNTER — Other Ambulatory Visit: Payer: Self-pay | Admitting: Internal Medicine

## 2023-02-14 ENCOUNTER — Other Ambulatory Visit: Payer: Self-pay | Admitting: Internal Medicine

## 2023-02-14 DIAGNOSIS — Z760 Encounter for issue of repeat prescription: Secondary | ICD-10-CM

## 2023-02-14 NOTE — Telephone Encounter (Signed)
Pt's pharmacy is requesting a refill on sildenafil. Would Dr. Taylor like to refill this medication? Please address 

## 2023-05-10 DIAGNOSIS — I10 Essential (primary) hypertension: Secondary | ICD-10-CM | POA: Diagnosis not present

## 2023-05-10 DIAGNOSIS — E785 Hyperlipidemia, unspecified: Secondary | ICD-10-CM | POA: Diagnosis not present

## 2023-05-10 DIAGNOSIS — R7303 Prediabetes: Secondary | ICD-10-CM | POA: Diagnosis not present

## 2023-05-16 DIAGNOSIS — N529 Male erectile dysfunction, unspecified: Secondary | ICD-10-CM | POA: Diagnosis not present

## 2023-05-16 DIAGNOSIS — G500 Trigeminal neuralgia: Secondary | ICD-10-CM | POA: Diagnosis not present

## 2023-05-16 DIAGNOSIS — F17210 Nicotine dependence, cigarettes, uncomplicated: Secondary | ICD-10-CM | POA: Diagnosis not present

## 2023-05-16 DIAGNOSIS — E785 Hyperlipidemia, unspecified: Secondary | ICD-10-CM | POA: Diagnosis not present

## 2023-05-16 DIAGNOSIS — F17200 Nicotine dependence, unspecified, uncomplicated: Secondary | ICD-10-CM | POA: Diagnosis not present

## 2023-05-16 DIAGNOSIS — N289 Disorder of kidney and ureter, unspecified: Secondary | ICD-10-CM | POA: Diagnosis not present

## 2023-05-16 DIAGNOSIS — Z79899 Other long term (current) drug therapy: Secondary | ICD-10-CM | POA: Diagnosis not present

## 2023-05-16 DIAGNOSIS — I252 Old myocardial infarction: Secondary | ICD-10-CM | POA: Diagnosis not present

## 2023-05-16 DIAGNOSIS — I10 Essential (primary) hypertension: Secondary | ICD-10-CM | POA: Diagnosis not present

## 2023-05-16 DIAGNOSIS — Z0000 Encounter for general adult medical examination without abnormal findings: Secondary | ICD-10-CM | POA: Diagnosis not present

## 2023-05-16 DIAGNOSIS — R809 Proteinuria, unspecified: Secondary | ICD-10-CM | POA: Diagnosis not present

## 2023-05-16 DIAGNOSIS — R7303 Prediabetes: Secondary | ICD-10-CM | POA: Diagnosis not present

## 2023-06-20 DIAGNOSIS — F17210 Nicotine dependence, cigarettes, uncomplicated: Secondary | ICD-10-CM | POA: Diagnosis not present

## 2023-06-20 DIAGNOSIS — Z6820 Body mass index (BMI) 20.0-20.9, adult: Secondary | ICD-10-CM | POA: Diagnosis not present

## 2023-06-20 DIAGNOSIS — F17200 Nicotine dependence, unspecified, uncomplicated: Secondary | ICD-10-CM | POA: Diagnosis not present

## 2023-06-20 DIAGNOSIS — G500 Trigeminal neuralgia: Secondary | ICD-10-CM | POA: Diagnosis not present

## 2023-06-20 DIAGNOSIS — Z79899 Other long term (current) drug therapy: Secondary | ICD-10-CM | POA: Diagnosis not present

## 2023-06-20 DIAGNOSIS — R202 Paresthesia of skin: Secondary | ICD-10-CM | POA: Diagnosis not present

## 2023-06-20 DIAGNOSIS — Z713 Dietary counseling and surveillance: Secondary | ICD-10-CM | POA: Diagnosis not present

## 2023-06-20 DIAGNOSIS — I10 Essential (primary) hypertension: Secondary | ICD-10-CM | POA: Diagnosis not present

## 2023-07-04 DIAGNOSIS — G500 Trigeminal neuralgia: Secondary | ICD-10-CM | POA: Diagnosis not present

## 2023-07-04 DIAGNOSIS — R202 Paresthesia of skin: Secondary | ICD-10-CM | POA: Diagnosis not present

## 2023-07-04 DIAGNOSIS — F17200 Nicotine dependence, unspecified, uncomplicated: Secondary | ICD-10-CM | POA: Diagnosis not present

## 2023-07-04 DIAGNOSIS — N289 Disorder of kidney and ureter, unspecified: Secondary | ICD-10-CM | POA: Diagnosis not present

## 2023-07-04 DIAGNOSIS — E559 Vitamin D deficiency, unspecified: Secondary | ICD-10-CM | POA: Diagnosis not present

## 2023-07-04 DIAGNOSIS — I10 Essential (primary) hypertension: Secondary | ICD-10-CM | POA: Diagnosis not present

## 2023-07-12 ENCOUNTER — Other Ambulatory Visit (HOSPITAL_COMMUNITY): Payer: Self-pay | Admitting: Internal Medicine

## 2023-07-12 DIAGNOSIS — R200 Anesthesia of skin: Secondary | ICD-10-CM

## 2023-07-26 DIAGNOSIS — M79641 Pain in right hand: Secondary | ICD-10-CM | POA: Diagnosis not present

## 2023-07-26 DIAGNOSIS — M79642 Pain in left hand: Secondary | ICD-10-CM | POA: Diagnosis not present

## 2023-08-15 DIAGNOSIS — I119 Hypertensive heart disease without heart failure: Secondary | ICD-10-CM | POA: Diagnosis not present

## 2023-08-15 DIAGNOSIS — Z79899 Other long term (current) drug therapy: Secondary | ICD-10-CM | POA: Diagnosis not present

## 2023-08-15 DIAGNOSIS — F17200 Nicotine dependence, unspecified, uncomplicated: Secondary | ICD-10-CM | POA: Diagnosis not present

## 2023-08-15 DIAGNOSIS — R202 Paresthesia of skin: Secondary | ICD-10-CM | POA: Diagnosis not present

## 2023-08-15 DIAGNOSIS — G500 Trigeminal neuralgia: Secondary | ICD-10-CM | POA: Diagnosis not present

## 2023-08-15 DIAGNOSIS — I10 Essential (primary) hypertension: Secondary | ICD-10-CM | POA: Diagnosis not present

## 2023-08-15 DIAGNOSIS — Z713 Dietary counseling and surveillance: Secondary | ICD-10-CM | POA: Diagnosis not present

## 2023-08-15 DIAGNOSIS — Z6820 Body mass index (BMI) 20.0-20.9, adult: Secondary | ICD-10-CM | POA: Diagnosis not present

## 2023-08-28 NOTE — Progress Notes (Signed)
[facility]  PATIENT: [name] DOB: 14-Aug-1951  REFERRING DOCTOR OR PCP:  Nita Sells, MD; Lupita Raider, NP SOURCE: Patient, notes from PCP  _________________________________   HISTORICAL  CHIEF COMPLAINT:  Chief Complaint  Patient presents with   Room 11    Pt is here Alone. Pt states that he couldn't smell or taste. Pt states that he feels like he has a hot needle jamming in his lip and going into her eye. Pt states that his pain is consistent. Pt states that his hands feel like they are in ice. Pt states that the tip of his fingers are numb. Pt states that his hands throb.     HISTORY OF PRESENT ILLNESS:  I had the pleasure of seeing patient, [name], at [facility] for neurologic consultation regarding his dysesthesias in the face and hands.  He is a 72 year old man who loss his sense of smell and taste.   He needed to have the sinuses drained with a neele apparently.  After that, he started to have numbness and dysesthesias - starting in the middle of his lips and then spreading to the right cheek.      Sensation is burning and throbbing.     He notes pain increases with shaving, oral care, opening mouth, sometimes with eating.    Gabapentin did not help.    He began to experience left hand tingling and painful dysesthesias, two months ago after playing guitar a long time.   He still has tingling and was referred to Dr. Orie Rout and is scheduled for NCV/EMG.  However, these symptoms have improved.   He has a meningioma (sphenoid wing over posterior right clinoid (18 mm in maximal diameter).  It is displacing the right optic chiasm and may be affecting cavernous sinus.    He saw Dr. Channing Mutters a couple times who advised monitoring it and not doing surgery at the time.   Reportedly had a couple MRIs that we do not have access to (does not know where they were done)   Imaging: MRI of the brain 02/01/2013 shows a right sphenoid wing meningioma over  the posterior clinoid it measured 18 mm and displaces the optic chiasm towards the left.  It extends slightly into the cavernous sinus.  There is also extensive T2/FLAIR hyperintense foci in the hemispheres and pons consistent with advanced chronic microvascular ischemic change or sequela of radiation.  REVIEW OF SYSTEMS: Constitutional: No fevers, chills, sweats, or change in appetite Eyes: No visual changes, double vision, eye pain Ear, nose and throat: No hearing loss, ear pain, nasal congestion, sore throat Cardiovascular: No chest pain, palpitations Respiratory:  No shortness of breath at rest or with exertion.   No wheezes GastrointestinaI: No nausea, vomiting, diarrhea, abdominal pain, fecal incontinence Genitourinary:  No dysuria, urinary retention or frequency.  No nocturia. Musculoskeletal:  No neck pain, back pain Integumentary: No rash, pruritus, skin lesions Neurological: as above Psychiatric: No depression at this time.  No anxiety Endocrine: No palpitations, diaphoresis, change in appetite, change in weigh or increased thirst Hematologic/Lymphatic:  No anemia, purpura, petechiae. Allergic/Immunologic: No itchy/runny eyes, nasal congestion, recent allergic reactions, rashes  ALLERGIES: Allergies  Allergen Reactions   Sulfa Antibiotics Hives    HOME MEDICATIONS:  Current Outpatient Medications:    acetaminophen (TYLENOL) 500 MG tablet, Take 500 mg by mouth every 6 (six) hours as needed for moderate pain or headache., Disp: , Rfl:    amLODipine (NORVASC) 10 MG tablet, TAKE  1 TABLET BY MOUTH EVERY DAY, Disp: 90 tablet, Rfl: 3   aspirin EC 81 MG EC tablet, Take 1 tablet (81 mg total) by mouth daily., Disp: , Rfl:    atorvastatin (LIPITOR) 80 MG tablet, Take 1 tablet (80 mg total) by mouth daily at 6 PM. (Patient taking differently: Take 80 mg by mouth daily.), Disp: 30 tablet, Rfl: 6   carvedilol (COREG) 6.25 MG tablet, TAKE 1 TABLET BY MOUTH TWICE A DAY, Disp: 180 tablet,  Rfl: 3   cloNIDine (CATAPRES) 0.1 MG tablet, Take 0.1 mg by mouth 2 (two) times daily., Disp: , Rfl:    clopidogrel (PLAVIX) 75 MG tablet, TAKE 1 TABLET BY MOUTH EVERY DAY, Disp: 90 tablet, Rfl: 3   diphenhydrAMINE (BENADRYL) 25 mg capsule, Take 25 mg by mouth daily as needed (drainage)., Disp: , Rfl:    losartan (COZAAR) 50 MG tablet, TAKE 1 TABLET BY MOUTH EVERY DAY, Disp: 90 tablet, Rfl: 3   oxyCODONE (OXY IR/ROXICODONE) 5 MG immediate release tablet, Take 1 tablet (5 mg total) by mouth every 6 (six) hours as needed for severe pain., Disp: 20 tablet, Rfl: 0   sildenafil (REVATIO) 20 MG tablet, TAKE 2 TO 3 TABLETS BY MOUTH DAILY AS NEEDED, Disp: 30 tablet, Rfl: 5  PAST MEDICAL HISTORY: Past Medical History:  Diagnosis Date   Acute MI (HCC) 08/2012   CAD (coronary artery disease)    a. 08/2012 Inferolateral STEMI/Cath/PCI: LM nl, LAD minor irregs, RI 20p, LCX nl, OM1 100d (Tx w 2.75x23mm Promus DES), RCA nondom, minor irregs, EF60-65%.   Chronic bronchitis (HCC)    Complication of anesthesia    difficulty waking up   Environmental allergies    Full dentures    GERD (gastroesophageal reflux disease)    History of gout X 1   History of hiatal hernia    History of kidney stones    HTN (hypertension) 03/10/2018   Hypertension    Inguinal hernia    right    NSTEMI (non-ST elevated myocardial infarction) (HCC) 03/08/2018   Pneumonia    as a baby   S/P angioplasty with stent to mid to distal LCX with DES 03/08/18  03/10/2018   Tobacco abuse    Wears glasses     PAST SURGICAL HISTORY: Past Surgical History:  Procedure Laterality Date   APPENDECTOMY     CHOLECYSTECTOMY     COLONOSCOPY W/ BIOPSIES AND POLYPECTOMY     CORONARY STENT INTERVENTION N/A 03/08/2018   Procedure: CORONARY STENT INTERVENTION;  Surgeon: Lyn Records, MD;  [facility];  Service: Cardiovascular;  Laterality: N/A;   INGUINAL HERNIA REPAIR Right 10/17/2019   Procedure: RIGHT INGUINAL HERNIA REPAIR  WITH MESH;  Surgeon: Manus Rudd, MD;  Location: [facility] OR;  Service: General;  Laterality: Right;  LMA TAP BLOCK   INSERTION OF MESH Right 10/17/2019   Procedure: INSERTION OF MESH;  Surgeon: Manus Rudd, MD;  [facility];  Service: General;  Laterality: Right;   LEFT HEART CATH AND CORONARY ANGIOGRAPHY N/A 03/08/2018   Procedure: LEFT HEART CATH AND CORONARY ANGIOGRAPHY;  Surgeon: Lyn Records, MD;  [facility];  Service: Cardiovascular;  Laterality: N/A;   LEFT HEART CATHETERIZATION WITH CORONARY ANGIOGRAM N/A 08/25/2012   Procedure: LEFT HEART CATHETERIZATION WITH CORONARY ANGIOGRAM;  Surgeon: Herby Abraham, MD;  Location: [facility] CATH LAB;  Service: Cardiovascular;  Laterality: N/A;   MULTIPLE TOOTH EXTRACTIONS     PERCUTANEOUS CORONARY STENT INTERVENTION (PCI-S) Left  08/25/2012   Procedure: PERCUTANEOUS CORONARY STENT INTERVENTION (PCI-S);  Surgeon: Herby Abraham, MD;  Location: [facility] CATH LAB;  Service: Cardiovascular;  Laterality: Left;  stent x 1 om    FAMILY HISTORY: Family History  Problem Relation Age of Onset   Hypertension Mother    Hypertension Father    Stroke Father     SOCIAL HISTORY: Social History   Socioeconomic History   Marital status: Legally Separated    Spouse name: Not on file   Number of children: Not on file   Years of education: Not on file   Highest education level: Not on file  Occupational History   Not on file  Tobacco Use   Smoking status: Every Day    Current packs/day: 0.75    Average packs/day: 0.8 packs/day for 55.5 years (41.6 ttl pk-yrs)    Types: Cigarettes    Start date: 03/14/1968   Smokeless tobacco: Never  Vaping Use   Vaping status: Never Used  Substance and Sexual Activity   Alcohol use: No    Alcohol/week: 0.0 standard drinks of alcohol   Drug use: No   Sexual activity: Yes  Other Topics Concern   Not on file  Social History Narrative   Not on file   Social Determinants of Health   Financial  Resource Strain: Not on file  Food Insecurity: Not on file  Transportation Needs: Not on file  Physical Activity: Not on file  Stress: Not on file  Social Connections: Not on file  Intimate Partner Violence: Not on file       PHYSICAL EXAM  Vitals:   08/30/23 1032  BP: 120/82  Pulse: 75  Weight: 133 lb (60.3 kg)  Height: 5\' 7"  (1.702 m)    Body mass index is 20.83 kg/m.   General: The patient is well-developed and well-nourished and in no acute distress  HEENT:  Head is [place]/AT.  Sclera are anicteric.  Funduscopic exam shows normal optic discs and retinal vessels.  Neck: No carotid bruits are noted.  The neck is nontender.  Cardiovascular: The heart has a regular rate and rhythm with a normal S1 and S2. There were no murmurs, gallops or rubs.    Skin: Extremities are without rash or  edema.  Musculoskeletal:  Back is nontender  Neurologic Exam  Mental status: The patient is alert and oriented x 3 at the time of the examination. The patient has apparent normal recent and remote memory, with an apparently normal attention span and concentration ability.   Speech is normal.  Cranial nerves: Extraocular movements are full. Pupils are equal, round, and reactive to light and accomodation.  Visual fields are full.  Facial symmetry is present.  Mild asymmetry to touch and temperature sensation in the face, less on the right.  Sensation seem to be normal over the tongue and pharynx..Facial strength is normal.  Trapezius and sternocleidomastoid strength is normal. No dysarthria is noted.  The tongue is midline, and the patient has symmetric elevation of the soft palate. No obvious hearing deficits are noted.  Motor:  Muscle bulk is normal.   Tone is normal. Strength is  5 / 5 in all 4 extremities.   Sensory: Sensory testing is intact to pinprick, soft touch and vibration sensation in all 4 extremities.  Coordination: Cerebellar testing reveals good finger-nose-finger and  heel-to-shin bilaterally.  Gait and station: Station is normal.   Gait is normal. Tandem gait is normal for age. Romberg is negative.   Reflexes:  Deep tendon reflexes are symmetric and normal bilaterally.   Plantar responses are flexor.    DIAGNOSTIC DATA (LABS, IMAGING, TESTING) - I reviewed patient records, labs, notes, testing and imaging myself where available.  Lab Results  Component Value Date   WBC 8.1 10/17/2019   HGB 15.8 10/17/2019   HCT 48.6 10/17/2019   MCV 97.2 10/17/2019   PLT 219 10/17/2019      Component Value Date/Time   NA 141 03/09/2018 0023   K 3.6 03/09/2018 0023   CL 110 03/09/2018 0023   CO2 25 03/09/2018 0023   GLUCOSE 98 03/09/2018 0023   BUN 9 03/09/2018 0023   CREATININE 1.04 03/09/2018 0023   CALCIUM 9.0 03/09/2018 0023   PROT 7.1 03/08/2018 0351   ALBUMIN 3.6 03/08/2018 0351   AST 18 03/08/2018 0351   ALT 18 03/08/2018 0351   ALKPHOS 104 03/08/2018 0351   BILITOT 0.6 03/08/2018 0351   GFRNONAA >60 03/09/2018 0023   GFRAA >60 03/09/2018 0023   Lab Results  Component Value Date   CHOL 131 03/09/2018   HDL 32 (L) 03/09/2018   LDLCALC 78 03/09/2018   TRIG 104 03/09/2018   CHOLHDL 4.1 03/09/2018   No results found for: "HGBA1C" No results found for: "VITAMINB12" No results found for: "TSH"     ASSESSMENT AND PLAN  Right trigeminal neuralgia - Plan: MR BRAIN W WO CONTRAST  Meningioma of right sphenoid wing involving cavernous sinus (HCC) - Plan: MR BRAIN W WO CONTRAST  Dysesthesia   In summary, [name] is a 72 year old man with facial pain that has been present for many years on the right side of his face.  The most likely explanation for his facial pain is to the sphenoid wing meningioma on the right that is adjacent to the cavernous sinus with possible invasion fortunately, he does not have involvement of other cranial nerves.  He apparently had a couple MRIs in a row about 10 years ago showing stability of the meningioma.  We  just have access to 1 of these films showing the 18 mm tumor.  We need to check another MRI of the brain to determine if there has been significant progression to help determine if he needs to follow this up with neurosurgery.  To help with the actual pain, we will begin lamotrigine and titrate up to 50 mg p.o. twice daily.  I also considered oxcarbazepine but because his GFR is reduced (grade 2 CRF) I would be a little bit more concerned about the risk of hyponatremia with this medication.  However, if the lamotrigine is not beneficial I would consider a trial of oxcarbazepine.  I did discuss the risk of rash with lamotrigine.  We also discussed that if he tolerates it well the dose may be increased.  A second problem is the hand pain and numbness on the left.  This has significantly improved so will not require further evaluation.  This possibly could represent carpal tunnel syndrome.  He is advised to let us know if this worsens.  He has seen orthopedics and has a NCV pending  Thank you for asking me to see [name].  Please let me know if I can be of further assistance with him or other patients in the future.      This visit is part of a comprehensive longitudinal care medical relationship regarding the patients primary diagnosis of meningioma and related concerns.   Akram Kissick A. Epimenio Foot, MD, [facility] 08/30/2023, 10:44 AM Certified in  Neurology, Clinical Neurophysiology, Sleep Medicine and Neuroimaging  [facility] [address], [address] [place], [place] [address] [phone]

## 2023-08-30 ENCOUNTER — Encounter: Payer: Self-pay | Admitting: Neurology

## 2023-08-30 ENCOUNTER — Ambulatory Visit (INDEPENDENT_AMBULATORY_CARE_PROVIDER_SITE_OTHER): Payer: No Typology Code available for payment source | Admitting: Neurology

## 2023-08-30 VITALS — BP 120/82 | HR 75 | Ht 67.0 in | Wt 133.0 lb

## 2023-08-30 DIAGNOSIS — G500 Trigeminal neuralgia: Secondary | ICD-10-CM | POA: Diagnosis not present

## 2023-08-30 DIAGNOSIS — D329 Benign neoplasm of meninges, unspecified: Secondary | ICD-10-CM

## 2023-08-30 DIAGNOSIS — R208 Other disturbances of skin sensation: Secondary | ICD-10-CM

## 2023-08-30 MED ORDER — LAMOTRIGINE 25 MG PO TABS: ORAL_TABLET | ORAL | 5 refills | Status: DC

## 2023-08-30 NOTE — Patient Instructions (Signed)
The pharmacy has the prescription of lamotrigine 25 mg. Please take it as follows:  For 1 week take 1 pill once a day. The second week, take 1 pill twice a day. The third week, take 1 pill in the morning and 2 at bedtime or evening. The fourth week take 2 pills twice a day.  Most people tolerate lamotrigine very well.  Some will get a rash.  If you do get a significant rash stop the medicine immediately and let us know.

## 2023-09-06 ENCOUNTER — Telehealth: Payer: Self-pay | Admitting: Neurology

## 2023-09-06 NOTE — Telephone Encounter (Signed)
[facility]: [id number] exp. 09/06/23-11/06/23 sent to GI [phone]

## 2023-09-21 ENCOUNTER — Telehealth: Payer: Self-pay | Admitting: Neurology

## 2023-09-21 DIAGNOSIS — G5603 Carpal tunnel syndrome, bilateral upper limbs: Secondary | ICD-10-CM | POA: Diagnosis not present

## 2023-09-21 DIAGNOSIS — G5613 Other lesions of median nerve, bilateral upper limbs: Secondary | ICD-10-CM | POA: Diagnosis not present

## 2023-09-21 NOTE — Telephone Encounter (Signed)
Called back. Relayed he would like for him to complete MRI. She can call [facility] back at [phone] to schedule.  Also went over lamotrigine instructions given:  The pharmacy has the prescription of lamotrigine 25 mg. Please take it as follows:   For 1 week take 1 pill once a day. The second week, take 1 pill twice a day. The third week, take 1 pill in the morning and 2 at bedtime or evening. The fourth week take 2 pills twice a day.   Most people tolerate lamotrigine very well.  Some will get a rash.  If you do get a significant rash stop the medicine immediately and let us know.      She also asked that I send a mychart message. I sent this.

## 2023-09-21 NOTE — Telephone Encounter (Signed)
Dr. Epimenio Foot prescribed Lamotrigine and patient advised it's working. He wanted to discuss to see if Dr. Epimenio Foot still wanted him to have an MRI. Please call Maya daughter to discuss and she is on the [place].

## 2023-10-05 ENCOUNTER — Encounter: Payer: Self-pay | Admitting: Neurology

## 2023-10-11 ENCOUNTER — Ambulatory Visit
Admission: RE | Admit: 2023-10-11 | Discharge: 2023-10-11 | Disposition: A | Discharge status: Home / Self Care | Payer: No Typology Code available for payment source | Source: Ambulatory Visit | Attending: Neurology | Admitting: Neurology

## 2023-10-11 DIAGNOSIS — D329 Benign neoplasm of meninges, unspecified: Secondary | ICD-10-CM | POA: Diagnosis not present

## 2023-10-11 DIAGNOSIS — G500 Trigeminal neuralgia: Secondary | ICD-10-CM

## 2023-10-11 MED ORDER — GADOPICLENOL 0.5 MMOL/ML IV SOLN
6.0000 mL | Freq: Once | INTRAVENOUS | Status: AC | PRN
  Administered 2023-10-11: 6 mL via INTRAVENOUS

## 2023-10-12 DIAGNOSIS — J069 Acute upper respiratory infection, unspecified: Secondary | ICD-10-CM | POA: Diagnosis not present

## 2023-10-12 DIAGNOSIS — F17200 Nicotine dependence, unspecified, uncomplicated: Secondary | ICD-10-CM | POA: Diagnosis not present

## 2023-10-12 DIAGNOSIS — R059 Cough, unspecified: Secondary | ICD-10-CM | POA: Diagnosis not present

## 2023-10-13 ENCOUNTER — Encounter: Payer: Self-pay | Admitting: Neurology

## 2023-10-31 DIAGNOSIS — J4541 Moderate persistent asthma with (acute) exacerbation: Secondary | ICD-10-CM | POA: Diagnosis not present

## 2023-10-31 DIAGNOSIS — R059 Cough, unspecified: Secondary | ICD-10-CM | POA: Diagnosis not present

## 2023-11-14 DIAGNOSIS — Z125 Encounter for screening for malignant neoplasm of prostate: Secondary | ICD-10-CM | POA: Diagnosis not present

## 2023-11-14 DIAGNOSIS — E785 Hyperlipidemia, unspecified: Secondary | ICD-10-CM | POA: Diagnosis not present

## 2023-11-14 DIAGNOSIS — R7303 Prediabetes: Secondary | ICD-10-CM | POA: Diagnosis not present

## 2023-11-22 DIAGNOSIS — I252 Old myocardial infarction: Secondary | ICD-10-CM | POA: Diagnosis not present

## 2023-11-22 DIAGNOSIS — R809 Proteinuria, unspecified: Secondary | ICD-10-CM | POA: Diagnosis not present

## 2023-11-22 DIAGNOSIS — N529 Male erectile dysfunction, unspecified: Secondary | ICD-10-CM | POA: Diagnosis not present

## 2023-11-22 DIAGNOSIS — E785 Hyperlipidemia, unspecified: Secondary | ICD-10-CM | POA: Diagnosis not present

## 2023-11-22 DIAGNOSIS — J4541 Moderate persistent asthma with (acute) exacerbation: Secondary | ICD-10-CM | POA: Diagnosis not present

## 2023-11-22 DIAGNOSIS — I10 Essential (primary) hypertension: Secondary | ICD-10-CM | POA: Diagnosis not present

## 2023-11-22 DIAGNOSIS — F17200 Nicotine dependence, unspecified, uncomplicated: Secondary | ICD-10-CM | POA: Diagnosis not present

## 2023-11-22 DIAGNOSIS — R7303 Prediabetes: Secondary | ICD-10-CM | POA: Diagnosis not present

## 2023-11-22 DIAGNOSIS — N289 Disorder of kidney and ureter, unspecified: Secondary | ICD-10-CM | POA: Diagnosis not present

## 2023-11-22 DIAGNOSIS — G500 Trigeminal neuralgia: Secondary | ICD-10-CM | POA: Diagnosis not present

## 2023-12-08 ENCOUNTER — Other Ambulatory Visit: Payer: Self-pay | Admitting: Internal Medicine

## 2023-12-08 DIAGNOSIS — I119 Hypertensive heart disease without heart failure: Secondary | ICD-10-CM

## 2024-02-20 ENCOUNTER — Other Ambulatory Visit: Payer: Self-pay | Admitting: Internal Medicine

## 2024-02-20 DIAGNOSIS — Z760 Encounter for issue of repeat prescription: Secondary | ICD-10-CM

## 2024-02-21 NOTE — Telephone Encounter (Signed)
 This is a [place] pt.

## 2024-02-29 DIAGNOSIS — H52223 Regular astigmatism, bilateral: Secondary | ICD-10-CM | POA: Diagnosis not present

## 2024-02-29 DIAGNOSIS — H5213 Myopia, bilateral: Secondary | ICD-10-CM | POA: Diagnosis not present

## 2024-02-29 DIAGNOSIS — H524 Presbyopia: Secondary | ICD-10-CM | POA: Diagnosis not present

## 2024-03-05 ENCOUNTER — Other Ambulatory Visit: Payer: Self-pay | Admitting: Neurology

## 2024-03-05 NOTE — Telephone Encounter (Signed)
 Last seen on 08/30/23 No follow up scheduled

## 2024-04-05 ENCOUNTER — Other Ambulatory Visit: Payer: Self-pay | Admitting: Internal Medicine

## 2024-05-02 ENCOUNTER — Other Ambulatory Visit: Payer: Self-pay | Admitting: Internal Medicine

## 2024-05-14 DIAGNOSIS — R7303 Prediabetes: Secondary | ICD-10-CM | POA: Diagnosis not present

## 2024-05-14 DIAGNOSIS — E785 Hyperlipidemia, unspecified: Secondary | ICD-10-CM | POA: Diagnosis not present

## 2024-06-04 ENCOUNTER — Other Ambulatory Visit: Payer: Self-pay | Admitting: Internal Medicine

## 2024-06-21 DIAGNOSIS — I2510 Atherosclerotic heart disease of native coronary artery without angina pectoris: Secondary | ICD-10-CM | POA: Diagnosis not present

## 2024-06-21 DIAGNOSIS — R7303 Prediabetes: Secondary | ICD-10-CM | POA: Diagnosis not present

## 2024-06-21 DIAGNOSIS — M1990 Unspecified osteoarthritis, unspecified site: Secondary | ICD-10-CM | POA: Diagnosis not present

## 2024-06-21 DIAGNOSIS — D332 Benign neoplasm of brain, unspecified: Secondary | ICD-10-CM | POA: Diagnosis not present

## 2024-06-21 DIAGNOSIS — E785 Hyperlipidemia, unspecified: Secondary | ICD-10-CM | POA: Diagnosis not present

## 2024-06-21 DIAGNOSIS — F17210 Nicotine dependence, cigarettes, uncomplicated: Secondary | ICD-10-CM | POA: Diagnosis not present

## 2024-06-21 DIAGNOSIS — G500 Trigeminal neuralgia: Secondary | ICD-10-CM | POA: Diagnosis not present

## 2024-06-21 DIAGNOSIS — I252 Old myocardial infarction: Secondary | ICD-10-CM | POA: Diagnosis not present

## 2024-06-21 DIAGNOSIS — G5603 Carpal tunnel syndrome, bilateral upper limbs: Secondary | ICD-10-CM | POA: Diagnosis not present

## 2024-06-21 DIAGNOSIS — J4540 Moderate persistent asthma, uncomplicated: Secondary | ICD-10-CM | POA: Diagnosis not present

## 2024-06-21 DIAGNOSIS — I10 Essential (primary) hypertension: Secondary | ICD-10-CM | POA: Diagnosis not present

## 2024-06-21 DIAGNOSIS — Z0000 Encounter for general adult medical examination without abnormal findings: Secondary | ICD-10-CM | POA: Diagnosis not present

## 2024-07-02 DIAGNOSIS — M180 Bilateral primary osteoarthritis of first carpometacarpal joints: Secondary | ICD-10-CM | POA: Diagnosis not present

## 2024-07-02 DIAGNOSIS — G5603 Carpal tunnel syndrome, bilateral upper limbs: Secondary | ICD-10-CM | POA: Diagnosis not present

## 2024-07-02 DIAGNOSIS — M720 Palmar fascial fibromatosis [Dupuytren]: Secondary | ICD-10-CM | POA: Diagnosis not present

## 2024-07-05 ENCOUNTER — Other Ambulatory Visit: Payer: Self-pay | Admitting: Internal Medicine

## 2024-07-06 ENCOUNTER — Telehealth: Payer: Self-pay | Admitting: Internal Medicine

## 2024-07-06 MED ORDER — CARVEDILOL 6.25 MG PO TABS: 6.2500 mg | ORAL_TABLET | Freq: Two times a day (BID) | ORAL | 3 refills | Status: DC

## 2024-07-06 NOTE — Telephone Encounter (Signed)
 Refill complete

## 2024-07-06 NOTE — Telephone Encounter (Signed)
 Pt came in office and stated that his pharmacy [facility] [address] is saying they need Dr. Waddell to sign off on his med refill for carvedilol  (COREG ) 6.25 MG tablet , he stated he is completely out and needs this RX ASAP. [phone] is the best number to reach the pt.

## 2024-07-09 DIAGNOSIS — R809 Proteinuria, unspecified: Secondary | ICD-10-CM | POA: Diagnosis not present

## 2024-07-09 DIAGNOSIS — E785 Hyperlipidemia, unspecified: Secondary | ICD-10-CM | POA: Diagnosis not present

## 2024-07-09 DIAGNOSIS — J4541 Moderate persistent asthma with (acute) exacerbation: Secondary | ICD-10-CM | POA: Diagnosis not present

## 2024-07-09 DIAGNOSIS — I10 Essential (primary) hypertension: Secondary | ICD-10-CM | POA: Diagnosis not present

## 2024-07-09 DIAGNOSIS — N529 Male erectile dysfunction, unspecified: Secondary | ICD-10-CM | POA: Diagnosis not present

## 2024-07-09 DIAGNOSIS — I252 Old myocardial infarction: Secondary | ICD-10-CM | POA: Diagnosis not present

## 2024-07-09 DIAGNOSIS — Z20822 Contact with and (suspected) exposure to covid-19: Secondary | ICD-10-CM | POA: Diagnosis not present

## 2024-07-09 DIAGNOSIS — R051 Acute cough: Secondary | ICD-10-CM | POA: Diagnosis not present

## 2024-07-09 DIAGNOSIS — F17210 Nicotine dependence, cigarettes, uncomplicated: Secondary | ICD-10-CM | POA: Diagnosis not present

## 2024-07-09 DIAGNOSIS — G500 Trigeminal neuralgia: Secondary | ICD-10-CM | POA: Diagnosis not present

## 2024-07-09 DIAGNOSIS — R7303 Prediabetes: Secondary | ICD-10-CM | POA: Diagnosis not present

## 2024-07-09 DIAGNOSIS — N289 Disorder of kidney and ureter, unspecified: Secondary | ICD-10-CM | POA: Diagnosis not present

## 2024-07-12 ENCOUNTER — Emergency Department (HOSPITAL_COMMUNITY)

## 2024-07-12 ENCOUNTER — Inpatient Hospital Stay (HOSPITAL_COMMUNITY)
Admission: EM | Admit: 2024-07-12 | Discharge: 2024-07-14 | DRG: 192 | Disposition: A | Discharge status: Home / Self Care | Attending: Internal Medicine | Admitting: Internal Medicine

## 2024-07-12 ENCOUNTER — Encounter (HOSPITAL_COMMUNITY): Payer: Self-pay

## 2024-07-12 ENCOUNTER — Other Ambulatory Visit: Payer: Self-pay

## 2024-07-12 DIAGNOSIS — Z955 Presence of coronary angioplasty implant and graft: Secondary | ICD-10-CM

## 2024-07-12 DIAGNOSIS — J441 Chronic obstructive pulmonary disease with (acute) exacerbation: Principal | ICD-10-CM | POA: Diagnosis present

## 2024-07-12 DIAGNOSIS — Z87891 Personal history of nicotine dependence: Secondary | ICD-10-CM

## 2024-07-12 DIAGNOSIS — Z7982 Long term (current) use of aspirin: Secondary | ICD-10-CM

## 2024-07-12 DIAGNOSIS — Z7902 Long term (current) use of antithrombotics/antiplatelets: Secondary | ICD-10-CM

## 2024-07-12 DIAGNOSIS — I252 Old myocardial infarction: Secondary | ICD-10-CM

## 2024-07-12 DIAGNOSIS — E875 Hyperkalemia: Secondary | ICD-10-CM | POA: Diagnosis present

## 2024-07-12 DIAGNOSIS — Z1152 Encounter for screening for COVID-19: Secondary | ICD-10-CM

## 2024-07-12 DIAGNOSIS — R064 Hyperventilation: Secondary | ICD-10-CM | POA: Diagnosis not present

## 2024-07-12 DIAGNOSIS — J069 Acute upper respiratory infection, unspecified: Secondary | ICD-10-CM | POA: Diagnosis not present

## 2024-07-12 DIAGNOSIS — I10 Essential (primary) hypertension: Secondary | ICD-10-CM | POA: Diagnosis present

## 2024-07-12 DIAGNOSIS — Z8249 Family history of ischemic heart disease and other diseases of the circulatory system: Secondary | ICD-10-CM

## 2024-07-12 DIAGNOSIS — Z882 Allergy status to sulfonamides status: Secondary | ICD-10-CM

## 2024-07-12 DIAGNOSIS — B9789 Other viral agents as the cause of diseases classified elsewhere: Secondary | ICD-10-CM | POA: Diagnosis present

## 2024-07-12 DIAGNOSIS — Z87442 Personal history of urinary calculi: Secondary | ICD-10-CM

## 2024-07-12 DIAGNOSIS — R059 Cough, unspecified: Secondary | ICD-10-CM | POA: Diagnosis not present

## 2024-07-12 DIAGNOSIS — I2510 Atherosclerotic heart disease of native coronary artery without angina pectoris: Secondary | ICD-10-CM | POA: Diagnosis present

## 2024-07-12 DIAGNOSIS — Z79899 Other long term (current) drug therapy: Secondary | ICD-10-CM

## 2024-07-12 DIAGNOSIS — E785 Hyperlipidemia, unspecified: Secondary | ICD-10-CM | POA: Diagnosis present

## 2024-07-12 DIAGNOSIS — Z7951 Long term (current) use of inhaled steroids: Secondary | ICD-10-CM

## 2024-07-12 DIAGNOSIS — R0602 Shortness of breath: Secondary | ICD-10-CM | POA: Diagnosis not present

## 2024-07-12 LAB — BASIC METABOLIC PANEL WITH GFR
Anion gap: 17 — ABNORMAL HIGH (ref 5–15)
BUN: 20 mg/dL (ref 8–23)
CO2: 20 mmol/L — ABNORMAL LOW (ref 22–32)
Calcium: 9.4 mg/dL (ref 8.9–10.3)
Chloride: 107 mmol/L (ref 98–111)
Creatinine, Ser: 1.02 mg/dL (ref 0.61–1.24)
GFR, Estimated: >60 mL/min (ref >60)
Glucose, Bld: 129 mg/dL — ABNORMAL HIGH (ref 70–99)
Potassium: 3.9 mmol/L (ref 3.5–5.1)
Sodium: 144 mmol/L (ref 135–145)

## 2024-07-12 LAB — RESPIRATORY PANEL BY PCR

## 2024-07-12 LAB — PROCALCITONIN: Procalcitonin: <0.1 ng/mL

## 2024-07-12 LAB — CBC WITH DIFFERENTIAL/PLATELET
Abs Immature Granulocytes: 0.09 K/uL — ABNORMAL HIGH (ref 0.00–0.07)
Basophils Absolute: 0 K/uL (ref 0.0–0.1)
Basophils Relative: 0 %
Eosinophils Absolute: 0 K/uL (ref 0.0–0.5)
Eosinophils Relative: 0 %
HCT: 47.1 % (ref 39.0–52.0)
Hemoglobin: 15.1 g/dL (ref 13.0–17.0)
Immature Granulocytes: 1 %
Lymphocytes Relative: 3 %
Lymphs Abs: 0.6 K/uL — ABNORMAL LOW (ref 0.7–4.0)
MCH: 30.7 pg (ref 26.0–34.0)
MCHC: 32.1 g/dL (ref 30.0–36.0)
MCV: 95.7 fL (ref 80.0–100.0)
Monocytes Absolute: 0.6 K/uL (ref 0.1–1.0)
Monocytes Relative: 4 %
Neutro Abs: 16.9 K/uL — ABNORMAL HIGH (ref 1.7–7.7)
Neutrophils Relative %: 92 %
Platelets: 200 K/uL (ref 150–400)
RBC: 4.92 MIL/uL (ref 4.22–5.81)
RDW: 14.6 % (ref 11.5–15.5)
WBC: 18.3 K/uL — ABNORMAL HIGH (ref 4.0–10.5)
nRBC: 0 % (ref 0.0–0.2)

## 2024-07-12 LAB — BRAIN NATRIURETIC PEPTIDE: B Natriuretic Peptide: 93 pg/mL (ref 0.0–100.0)

## 2024-07-12 LAB — TROPONIN I (HIGH SENSITIVITY)
Troponin I (High Sensitivity): 12 ng/L (ref <18)
Troponin I (High Sensitivity): 10 ng/L (ref <18)

## 2024-07-12 LAB — RESP PANEL BY RT-PCR (RSV, FLU A&B, COVID)  RVPGX2
Influenza A by PCR: NEGATIVE
Influenza B by PCR: NEGATIVE
Resp Syncytial Virus by PCR: NEGATIVE
SARS Coronavirus 2 by RT PCR: NEGATIVE

## 2024-07-12 MED ORDER — DOXYCYCLINE HYCLATE 100 MG PO TABS
100.0000 mg | ORAL_TABLET | Freq: Two times a day (BID) | ORAL | Status: DC
  Administered 2024-07-12 – 2024-07-14 (×5): 100 mg via ORAL
  Filled 2024-07-12 (×5): qty 1

## 2024-07-12 MED ORDER — CLOPIDOGREL BISULFATE 75 MG PO TABS
75.0000 mg | ORAL_TABLET | Freq: Every day | ORAL | Status: DC
  Administered 2024-07-13 – 2024-07-14 (×2): 75 mg via ORAL
  Filled 2024-07-12 (×2): qty 1

## 2024-07-12 MED ORDER — ACETAMINOPHEN 325 MG PO TABS: 650.0000 mg | ORAL_TABLET | Freq: Four times a day (QID) | ORAL | Status: DC | PRN

## 2024-07-12 MED ORDER — METHYLPREDNISOLONE SODIUM SUCC 40 MG IJ SOLR
40.0000 mg | Freq: Two times a day (BID) | INTRAMUSCULAR | Status: DC
  Administered 2024-07-12 – 2024-07-14 (×4): 40 mg via INTRAVENOUS
  Filled 2024-07-12 (×4): qty 1

## 2024-07-12 MED ORDER — GUAIFENESIN-DM 100-10 MG/5ML PO SYRP
5.0000 mL | ORAL_SOLUTION | ORAL | Status: DC | PRN
  Administered 2024-07-13: 5 mL via ORAL
  Filled 2024-07-12: qty 5

## 2024-07-12 MED ORDER — METHYLPREDNISOLONE SODIUM SUCC 125 MG IJ SOLR
125.0000 mg | Freq: Once | INTRAMUSCULAR | Status: AC
  Administered 2024-07-12: 125 mg via INTRAVENOUS
  Filled 2024-07-12: qty 2

## 2024-07-12 MED ORDER — BUDESONIDE 0.25 MG/2ML IN SUSP
0.2500 mg | Freq: Two times a day (BID) | RESPIRATORY_TRACT | Status: DC
  Administered 2024-07-12 – 2024-07-14 (×5): 0.25 mg via RESPIRATORY_TRACT
  Filled 2024-07-12 (×5): qty 2

## 2024-07-12 MED ORDER — ALBUTEROL SULFATE (2.5 MG/3ML) 0.083% IN NEBU
10.0000 mg/h | INHALATION_SOLUTION | Freq: Once | RESPIRATORY_TRACT | Status: AC
  Administered 2024-07-12: 10 mg/h via RESPIRATORY_TRACT
  Filled 2024-07-12: qty 12

## 2024-07-12 MED ORDER — ASPIRIN 81 MG PO TBEC
81.0000 mg | DELAYED_RELEASE_TABLET | Freq: Every day | ORAL | Status: DC
  Administered 2024-07-13 – 2024-07-14 (×2): 81 mg via ORAL
  Filled 2024-07-12 (×2): qty 1

## 2024-07-12 MED ORDER — LOSARTAN POTASSIUM 50 MG PO TABS
50.0000 mg | ORAL_TABLET | Freq: Every day | ORAL | Status: DC
  Administered 2024-07-13 – 2024-07-14 (×2): 50 mg via ORAL
  Filled 2024-07-12 (×2): qty 1

## 2024-07-12 MED ORDER — IPRATROPIUM-ALBUTEROL 0.5-2.5 (3) MG/3ML IN SOLN
3.0000 mL | Freq: Four times a day (QID) | RESPIRATORY_TRACT | Status: DC
  Administered 2024-07-12 – 2024-07-14 (×8): 3 mL via RESPIRATORY_TRACT
  Filled 2024-07-12 (×8): qty 3

## 2024-07-12 MED ORDER — ATORVASTATIN CALCIUM 10 MG PO TABS
10.0000 mg | ORAL_TABLET | Freq: Every day | ORAL | Status: DC
  Administered 2024-07-13 – 2024-07-14 (×2): 10 mg via ORAL
  Filled 2024-07-12 (×2): qty 1

## 2024-07-12 MED ORDER — ONDANSETRON HCL 4 MG/2ML IJ SOLN: 4.0000 mg | Freq: Four times a day (QID) | INTRAMUSCULAR | Status: DC | PRN

## 2024-07-12 MED ORDER — AMLODIPINE BESYLATE 5 MG PO TABS
10.0000 mg | ORAL_TABLET | Freq: Every day | ORAL | Status: DC
  Administered 2024-07-13 – 2024-07-14 (×2): 10 mg via ORAL
  Filled 2024-07-12 (×2): qty 2

## 2024-07-12 MED ORDER — ENOXAPARIN SODIUM 40 MG/0.4ML IJ SOSY
40.0000 mg | PREFILLED_SYRINGE | INTRAMUSCULAR | Status: DC
  Administered 2024-07-12 – 2024-07-14 (×3): 40 mg via SUBCUTANEOUS
  Filled 2024-07-12 (×3): qty 0.4

## 2024-07-12 MED ORDER — AMOXICILLIN-POT CLAVULANATE 875-125 MG PO TABS
1.0000 | ORAL_TABLET | Freq: Once | ORAL | Status: AC
  Administered 2024-07-12: 1 via ORAL
  Filled 2024-07-12: qty 1

## 2024-07-12 MED ORDER — MUSCLE RUB 10-15 % EX CREA
TOPICAL_CREAM | CUTANEOUS | Status: DC | PRN
  Filled 2024-07-12: qty 85

## 2024-07-12 MED ORDER — ONDANSETRON HCL 4 MG PO TABS: 4.0000 mg | ORAL_TABLET | Freq: Four times a day (QID) | ORAL | Status: DC | PRN

## 2024-07-12 MED ORDER — ATORVASTATIN CALCIUM 40 MG PO TABS: 80.0000 mg | ORAL_TABLET | Freq: Every day | ORAL | Status: DC

## 2024-07-12 MED ORDER — ENSURE PLUS HIGH PROTEIN PO LIQD
237.0000 mL | Freq: Two times a day (BID) | ORAL | Status: DC
  Administered 2024-07-12 – 2024-07-14 (×4): 237 mL via ORAL

## 2024-07-12 MED ORDER — CARVEDILOL 3.125 MG PO TABS
6.2500 mg | ORAL_TABLET | Freq: Two times a day (BID) | ORAL | Status: DC
  Administered 2024-07-12 – 2024-07-14 (×4): 6.25 mg via ORAL
  Filled 2024-07-12 (×4): qty 2

## 2024-07-12 MED ORDER — ACETAMINOPHEN 650 MG RE SUPP: 650.0000 mg | Freq: Four times a day (QID) | RECTAL | Status: DC | PRN

## 2024-07-12 MED ORDER — IPRATROPIUM-ALBUTEROL 0.5-2.5 (3) MG/3ML IN SOLN
3.0000 mL | Freq: Once | RESPIRATORY_TRACT | Status: AC
  Administered 2024-07-12: 3 mL via RESPIRATORY_TRACT
  Filled 2024-07-12: qty 3

## 2024-07-12 MED ORDER — CLONIDINE HCL 0.1 MG PO TABS: 0.1000 mg | ORAL_TABLET | Freq: Two times a day (BID) | ORAL | Status: DC

## 2024-07-12 NOTE — ED Triage Notes (Signed)
 POV from home. Cc of SOB worse last night. Went to Dr Milford office Monday and was given antibiotics for his URI but isnt feeling any better. C/o SOB with exertion. Labored breathing. 95% room air.

## 2024-07-12 NOTE — ED Provider Notes (Signed)
 [place] EMERGENCY DEPARTMENT AT [facility]  Provider Note  CSN: [id number] Arrival date & time: 07/12/24 0545  History Chief Complaint  Patient presents with   Shortness of Breath    [name] is a 73 y.o. male with history of CAD, s/p MI x 2 and asthma with persistent tobacco use reports about a week of cough productive of thick white sputum and SOB/wheezing. No chest pains. He has an albuterol  inhaler and a nebulizer which quit working [employer]. He was seen by PCP 4 days ago and given Rx for medrol , zithromax and tessalon without improvement. No reported fevers.    Home Medications Prior to Admission medications   Medication Sig Start Date End Date Taking? Authorizing Provider  acetaminophen  (TYLENOL ) 500 MG tablet Take 500 mg by mouth every 6 (six) hours as needed for moderate pain or headache.    [provider]  amLODipine  (NORVASC ) 10 MG tablet TAKE 1 TABLET BY MOUTH EVERY DAY 06/04/24   Waddell Danelle ORN, MD  aspirin  EC 81 MG EC tablet Take 1 tablet (81 mg total) by mouth daily. 08/28/12   Vivienne Lonni Ingle, NP  atorvastatin  (LIPITOR ) 80 MG tablet Take 1 tablet (80 mg total) by mouth daily at 6 PM. Patient taking differently: Take 80 mg by mouth daily. 03/10/18   Marylu Leita JONELLE, NP  carvedilol  (COREG ) 6.25 MG tablet Take 1 tablet (6.25 mg total) by mouth 2 (two) times daily. 07/06/24   Waddell Danelle ORN, MD  cloNIDine  (CATAPRES ) 0.1 MG tablet Take 0.1 mg by mouth 2 (two) times daily.    [provider]  clopidogrel  (PLAVIX ) 75 MG tablet TAKE 1 TABLET BY MOUTH EVERY DAY 12/09/23   Waddell Danelle ORN, MD  diphenhydrAMINE (BENADRYL) 25 mg capsule Take 25 mg by mouth daily as needed (drainage).    [provider]  lamoTRIgine  (LAMICTAL ) 25 MG tablet TAKE 2 TABLETS BY MOUTH TWICE A DAY 03/05/24   Sater, Charlie LABOR, MD  losartan  (COZAAR ) 50 MG tablet TAKE 1 TABLET BY MOUTH EVERY DAY 12/09/23   Waddell Danelle ORN, MD  oxyCODONE  (OXY IR/ROXICODONE ) 5 MG  immediate release tablet Take 1 tablet (5 mg total) by mouth every 6 (six) hours as needed for severe pain. 10/17/19   Belinda Cough, MD  sildenafil  (REVATIO ) 20 MG tablet TAKE 2 TO 3 TABLETS BY MOUTH DAILY AS NEEDED 02/21/24   Waddell Danelle ORN, MD     Allergies    Sulfa antibiotics   Review of Systems   Review of Systems Please see HPI for pertinent positives and negatives  Physical Exam BP (!) 137/96   Pulse 83   Temp 97.9 F (36.6 C) (Oral)   Resp 19   Ht 5' 7 (1.702 m)   Wt 61 kg   SpO2 92%   BMI 21.06 kg/m   Physical Exam Vitals and nursing note reviewed.  Constitutional:      Appearance: Normal appearance.  HENT:     Head: Normocephalic and atraumatic.     Nose: Nose normal.     Mouth/Throat:     Mouth: Mucous membranes are moist.  Eyes:     Extraocular Movements: Extraocular movements intact.     Conjunctiva/sclera: Conjunctivae normal.  Cardiovascular:     Rate and Rhythm: Normal rate.  Pulmonary:     Effort: Pulmonary effort is normal.     Breath sounds: Wheezing present.  Abdominal:     General: Abdomen is flat.  Palpations: Abdomen is soft.     Tenderness: There is no abdominal tenderness.  Musculoskeletal:        General: No swelling. Normal range of motion.     Cervical back: Neck supple.     Right lower leg: No edema.     Left lower leg: No edema.  Skin:    General: Skin is warm and dry.  Neurological:     General: No focal deficit present.     Mental Status: He is alert.  Psychiatric:        Mood and Affect: Mood normal.     ED Results / Procedures / Treatments   EKG None  Procedures Procedures  Medications Ordered in the ED Medications  ipratropium-albuterol  (DUONEB) 0.5-2.5 (3) MG/3ML nebulizer solution 3 mL (3 mLs Nebulization Given 07/12/24 0611)  methylPREDNISolone  sodium succinate (SOLU-MEDROL ) 125 mg/2 mL injection 125 mg (125 mg Intravenous Given 07/12/24 0605)    Initial Impression and Plan  Patient here with SOB, cough  and wheezing. Suspect he has underlying COPD given his ongoing tobacco use, not improving with usual outpatient regimen. Will check labs, EKG, CXR and give a duoneb, IV steroids and reassess.   ED Course   Clinical Course as of 07/12/24 0703  Thu Jul 12, 2024  0606 CBC with leukocytosis, infection vs steroids.  [CS]  0606 EKG not in MUSE shows sinus rhythm without ischemic changes. [CS]  0639 BNP is normal.  [CS]  0656 Covid/Flu/RSV swab is neg.  [CS]  0702 BMP and trop are normal. Care of the patient signed out pending CXR, delta trop and reassessment.  [CS]    Clinical Course User Index [CS] Roselyn Carlin NOVAK, MD     MDM Rules/Calculators/A&P Medical Decision Making Problems Addressed: COPD exacerbation [facility]): acute illness or injury  Amount and/or Complexity of Data Reviewed Labs: ordered. Decision-making details documented in ED Course. Radiology: ordered and independent interpretation performed. Decision-making details documented in ED Course. ECG/medicine tests: ordered and independent interpretation performed. Decision-making details documented in ED Course.  Risk Prescription drug management.     Final Clinical Impression(s) / ED Diagnoses Final diagnoses:  COPD exacerbation [facility])    Rx / DC Orders ED Discharge Orders     None        Roselyn Carlin NOVAK, MD 07/12/24 [phone]

## 2024-07-12 NOTE — ED Notes (Signed)
 Delay in pt transfer to unit 300 d/t 1hour long neb tx being started before transferred by RT. Pt will be transferred at the end of his tx.

## 2024-07-12 NOTE — ED Notes (Signed)
 Received report from Maci M RN. Pt resting. States neb tx did help a little. Nad. A/o. Color wnl. Mild labored breathing.

## 2024-07-12 NOTE — ED Notes (Signed)
 Ambulated on ra. RA sats 94%  walked approx 50 feet. After about 20 feet pt started breathing heavy and sats were 88%. After sitting and resting 2 minutes pt still only 90%. 02 2L [place] applied. Pt sats up to 96% with the oxygen.

## 2024-07-12 NOTE — Plan of Care (Signed)

## 2024-07-12 NOTE — Progress Notes (Signed)
   07/12/24 1011  TOC Brief Assessment  Insurance and Status Reviewed  Patient has primary care physician Yes  Home environment has been reviewed Single family home  Prior level of function: Independent  Prior/Current Home Services No current home services  Social Drivers of Health Review SDOH reviewed no interventions necessary  Readmission risk has been reviewed Yes  Transition of care needs no transition of care needs at this time    Transition of Care Department [facility]) has reviewed patient and no TOC needs have been identified at this time. We will continue to monitor patient advancement through interdisciplinary progression rounds. If new patient transition needs arise, please place a TOC consult.

## 2024-07-12 NOTE — ED Notes (Signed)
 ED Provider at bedside.

## 2024-07-12 NOTE — H&P (Signed)
 History and Physical    [name] FMW:[id number] DOB: 12/11/1950 DOA: 07/12/2024  PCP: Shona Norleen PEDLAR, MD   Patient coming from: Home  Chief Complaint: Shortness of breath  HPI: [name] is a 73 y.o. male with medical history significant for tobacco abuse for many years, CAD with prior stents, hypertension, and dyslipidemia who presented to the ED with worsening shortness of breath as well as cough and phlegm production over the last several days to about a week.  He denies any fevers or chills or chest pain and apparently was seen by his PCP about 4 days ago and given some treatment for Solu-Medrol  as well as Zithromax and Tessalon Perles without any apparent improvement.  Daughter at bedside states that overall he appears to be getting weak as well.  Patient does have a nebulizer machine at home that appears to have broken recently.  He has not seen pulmonology in the past nor does he have a diagnosis of COPD.   ED Course: Vital signs stable and patient afebrile.  Did not require any oxygen at rest and was started on 2 L nasal cannula after noting some desaturation with ambulation.  Leukocytosis of 18,300 noted and chest x-ray with signs of emphysema noted.  Review of Systems: Reviewed as noted above, otherwise negative.  Past Medical History:  Diagnosis Date   Acute MI (HCC) 08/2012   CAD (coronary artery disease)    a. 08/2012 Inferolateral STEMI/Cath/PCI: LM nl, LAD minor irregs, RI 20p, LCX nl, OM1 100d (Tx w 2.75x74mm Promus DES), RCA nondom, minor irregs, EF60-65%.   Chronic bronchitis (HCC)    Complication of anesthesia    difficulty waking up   Environmental allergies    Full dentures    GERD (gastroesophageal reflux disease)    History of gout X 1   History of hiatal hernia    History of kidney stones    HTN (hypertension) 03/10/2018   Hypertension    Inguinal hernia    right    NSTEMI (non-ST elevated myocardial infarction) (HCC) 03/08/2018   Pneumonia    as a baby    S/P angioplasty with stent to mid to distal LCX with DES 03/08/18  03/10/2018   Tobacco abuse    Wears glasses     Past Surgical History:  Procedure Laterality Date   APPENDECTOMY     CHOLECYSTECTOMY     COLONOSCOPY W/ BIOPSIES AND POLYPECTOMY     CORONARY STENT INTERVENTION N/A 03/08/2018   Procedure: CORONARY STENT INTERVENTION;  Surgeon: Claudene Victory ORN, MD;  Location: [facility] INVASIVE CV LAB;  Service: Cardiovascular;  Laterality: N/A;   INGUINAL HERNIA REPAIR Right 10/17/2019   Procedure: RIGHT INGUINAL HERNIA REPAIR WITH MESH;  Surgeon: Belinda Cough, MD;  Location[facility] OR;  Service: General;  Laterality: Right;  LMA TAP BLOCK   INSERTION OF MESH Right 10/17/2019   Procedure: INSERTION OF MESH;  Surgeon: Belinda Cough, MD;  Location: [facility] OR;  Service: General;  Laterality: Right;   LEFT HEART CATH AND CORONARY ANGIOGRAPHY N/A 03/08/2018   Procedure: LEFT HEART CATH AND CORONARY ANGIOGRAPHY;  Surgeon: Claudene Victory ORN, MD;  Location: [facility] INVASIVE CV LAB;  Service: Cardiovascular;  Laterality: N/A;   LEFT HEART CATHETERIZATION WITH CORONARY ANGIOGRAM N/A 08/25/2012   Procedure: LEFT HEART CATHETERIZATION WITH CORONARY ANGIOGRAM;  Surgeon: Debby JONETTA Como, MD;  Location: [facility] CATH LAB;  Service: Cardiovascular;  Laterality: N/A;   MULTIPLE TOOTH EXTRACTIONS     PERCUTANEOUS CORONARY STENT INTERVENTION (PCI-S)  Left 08/25/2012   Procedure: PERCUTANEOUS CORONARY STENT INTERVENTION (PCI-S);  Surgeon: Debby JONETTA Como, MD;  Location: [facility] CATH LAB;  Service: Cardiovascular;  Laterality: Left;  stent x 1 om     reports that he has been smoking cigarettes. He started smoking about 56 years ago. He has a 42.2 pack-year smoking history. He has never used smokeless tobacco. He reports that he does not drink alcohol and does not use drugs.  Allergies  Allergen Reactions   Sulfa Antibiotics Hives    Family History  Problem Relation Age of Onset   Hypertension Mother    Hypertension Father    Stroke  Father     Prior to Admission medications   Medication Sig Start Date End Date Taking? Authorizing Provider  acetaminophen  (TYLENOL ) 500 MG tablet Take 500 mg by mouth every 6 (six) hours as needed for moderate pain or headache.    [provider]  amLODipine  (NORVASC ) 10 MG tablet TAKE 1 TABLET BY MOUTH EVERY DAY 06/04/24   Waddell Danelle ORN, MD  aspirin  EC 81 MG EC tablet Take 1 tablet (81 mg total) by mouth daily. 08/28/12   Vivienne Lonni Ingle, NP  atorvastatin  (LIPITOR ) 80 MG tablet Take 1 tablet (80 mg total) by mouth daily at 6 PM. Patient taking differently: Take 80 mg by mouth daily. 03/10/18   Marylu Leita SAUNDERS, NP  carvedilol  (COREG ) 6.25 MG tablet Take 1 tablet (6.25 mg total) by mouth 2 (two) times daily. 07/06/24   Waddell Danelle ORN, MD  cloNIDine  (CATAPRES ) 0.1 MG tablet Take 0.1 mg by mouth 2 (two) times daily.    [provider]  clopidogrel  (PLAVIX ) 75 MG tablet TAKE 1 TABLET BY MOUTH EVERY DAY 12/09/23   Waddell Danelle ORN, MD  diphenhydrAMINE (BENADRYL) 25 mg capsule Take 25 mg by mouth daily as needed (drainage).    [provider]  lamoTRIgine  (LAMICTAL ) 25 MG tablet TAKE 2 TABLETS BY MOUTH TWICE A DAY 03/05/24   Sater, Charlie LABOR, MD  losartan  (COZAAR ) 50 MG tablet TAKE 1 TABLET BY MOUTH EVERY DAY 12/09/23   Waddell Danelle ORN, MD  oxyCODONE  (OXY IR/ROXICODONE ) 5 MG immediate release tablet Take 1 tablet (5 mg total) by mouth every 6 (six) hours as needed for severe pain. 10/17/19   Belinda Cough, MD  sildenafil  (REVATIO ) 20 MG tablet TAKE 2 TO 3 TABLETS BY MOUTH DAILY AS NEEDED 02/21/24   Waddell Danelle ORN, MD    Physical Exam: Vitals:   07/12/24 0753 07/12/24 0830 07/12/24 0850 07/12/24 0851  BP:      Pulse:  76    Resp:  17    Temp:   98.6 F (37 C)   TempSrc:   Oral   SpO2: (!) 88% 97%  97%  Weight:      Height:        Constitutional: NAD, calm, comfortable, 2 L nasal cannula Vitals:   07/12/24 0753 07/12/24 0830 07/12/24 0850 07/12/24 0851   BP:      Pulse:  76    Resp:  17    Temp:   98.6 F (37 C)   TempSrc:   Oral   SpO2: (!) 88% 97%  97%  Weight:      Height:       Eyes: lids and conjunctivae normal Neck: normal, supple Respiratory: Mild wheezing bilaterally. Normal respiratory effort. No accessory muscle use.  Cardiovascular: Regular rate and rhythm, no murmurs. Abdomen: no tenderness, no distention. Bowel sounds positive.  Musculoskeletal:  No edema. Skin: no rashes, lesions, ulcers.  Psychiatric: Flat affect  Labs on Admission: I have personally reviewed following labs and imaging studies  CBC: Recent Labs  Lab 07/12/24 0557  WBC 18.3*  NEUTROABS 16.9*  HGB 15.1  HCT 47.1  MCV 95.7  PLT 200   Basic Metabolic Panel: Recent Labs  Lab 07/12/24 0557  NA 144  K 3.9  CL 107  CO2 20*  GLUCOSE 129*  BUN 20  CREATININE 1.02  CALCIUM  9.4   GFR: Estimated Creatinine Clearance: 54.8 mL/min (by C-G formula based on SCr of 1.02 mg/dL). Liver Function Tests: No results for input(s): AST, ALT, ALKPHOS, BILITOT, PROT, ALBUMIN in the last 168 hours. No results for input(s): LIPASE, AMYLASE in the last 168 hours. No results for input(s): AMMONIA in the last 168 hours. Coagulation Profile: No results for input(s): INR, PROTIME in the last 168 hours. Cardiac Enzymes: No results for input(s): CKTOTAL, CKMB, CKMBINDEX, TROPONINI in the last 168 hours. BNP (last 3 results) No results for input(s): PROBNP in the last 8760 hours. HbA1C: No results for input(s): HGBA1C in the last 72 hours. CBG: No results for input(s): GLUCAP in the last 168 hours. Lipid Profile: No results for input(s): CHOL, HDL, LDLCALC, TRIG, CHOLHDL, LDLDIRECT in the last 72 hours. Thyroid  Function Tests: No results for input(s): TSH, T4TOTAL, FREET4, T3FREE, THYROIDAB in the last 72 hours. Anemia Panel: No results for input(s): VITAMINB12, FOLATE, FERRITIN, TIBC,  IRON, RETICCTPCT in the last 72 hours. Urine analysis:    Component Value Date/Time   COLORURINE YELLOW 01/24/2016 2028   APPEARANCEUR CLEAR 01/24/2016 2028   LABSPEC 1.020 01/24/2016 2028   PHURINE 5.5 01/24/2016 2028   GLUCOSEU NEGATIVE 01/24/2016 2028   HGBUR MODERATE (A) 01/24/2016 2028   BILIRUBINUR NEGATIVE 01/24/2016 2028   KETONESUR TRACE (A) 01/24/2016 2028   PROTEINUR 30 (A) 01/24/2016 2028   NITRITE NEGATIVE 01/24/2016 2028   LEUKOCYTESUR NEGATIVE 01/24/2016 2028    Radiological Exams on Admission: DG Chest Port 1 View Result Date: 07/12/2024 EXAM: 1 VIEW XRAY OF THE CHEST 07/12/2024 06:27:00 AM COMPARISON: 03/08/2018 CLINICAL HISTORY: SOB, cough. Cc of SOB worse last night. Went to Dr Milford office Monday and was given antibiotics for his URI but isnt feeling any better. C/o SOB with exertion. Labored breathing. FINDINGS: LUNGS AND PLEURA: Hyperinflation. Emphysema. No focal pulmonary opacity. No pulmonary edema. No pleural effusion. No pneumothorax. HEART AND MEDIASTINUM: No acute abnormality of the cardiac and mediastinal silhouettes. BONES AND SOFT TISSUES: No acute osseous abnormality. IMPRESSION: 1. No acute findings. 2. Hyperinflation and emphysema. Electronically signed by: Waddell Calk MD 07/12/2024 07:07 AM EDT RP Workstation: [id number]    Assessment/Plan Principal Problem:   COPD with acute exacerbation (HCC)    Acute COPD exacerbation - DuoNebs every 6 hours - IV Solu-Medrol  twice daily - Pulmicort  twice daily - Continue on doxycycline  and given 1 dose of Augmentin  in ED - Check procalcitonin - Check full viral respiratory panel  CAD/dyslipidemia - Continue home medications  Hypertension - Continue home medications  History of tobacco abuse - Counseled on cessation   DVT prophylaxis: Lovenox  Code Status: Full Family Communication: Daughter at bedside 8/28 Disposition Plan:Admit for COPD exacerbation Consults called:None Admission status:  Obs, Tele  Severity of Illness: The appropriate patient status for this patient is OBSERVATION. Observation status is judged to be reasonable and necessary in order to provide the required intensity of service to ensure the patient's safety. The patient's presenting symptoms, physical exam  findings, and initial radiographic and laboratory data in the context of their medical condition is felt to place them at decreased risk for further clinical deterioration. Furthermore, it is anticipated that the patient will be medically stable for discharge from the hospital within 2 midnights of admission.    Timothy Beck D Maree DO [facility]  If 7PM-7AM, please contact night-coverage [email or web address]  07/12/2024, 9:22 AM

## 2024-07-12 NOTE — ED Provider Notes (Signed)
    ED Course / MDM   Clinical Course as of 07/12/24 0848  Thu Jul 12, 2024  0606 CBC with leukocytosis, infection vs steroids.  [CS]  0606 EKG not in MUSE shows sinus rhythm without ischemic changes. [CS]  0639 BNP is normal.  [CS]  0656 Covid/Flu/RSV swab is neg.  [CS]  0702 BMP and trop are normal. Care of the patient signed out pending CXR, delta trop and reassessment.  [CS]  0707 Received sign out from Dr. Roselyn p/w COPD exacerbation, CXR, re-assessment [WS]  0848 Patient still dyspneic, ambulated and was down to 88% on room air, Will give additional breathing tx. Will need admission. Discussed with Dr. Maree who will admit patient. [WS]    Clinical Course User Index [CS] Roselyn Carlin NOVAK, MD [WS] Francesca Elsie CROME, MD   Medical Decision Making Amount and/or Complexity of Data Reviewed Labs: ordered. Radiology: ordered.  Risk Prescription drug management. Decision regarding hospitalization.       Francesca Elsie CROME, MD 07/12/24 [phone]

## 2024-07-13 DIAGNOSIS — Z7951 Long term (current) use of inhaled steroids: Secondary | ICD-10-CM | POA: Diagnosis not present

## 2024-07-13 DIAGNOSIS — Z87442 Personal history of urinary calculi: Secondary | ICD-10-CM | POA: Diagnosis not present

## 2024-07-13 DIAGNOSIS — I252 Old myocardial infarction: Secondary | ICD-10-CM | POA: Diagnosis not present

## 2024-07-13 DIAGNOSIS — Z79899 Other long term (current) drug therapy: Secondary | ICD-10-CM | POA: Diagnosis not present

## 2024-07-13 DIAGNOSIS — E785 Hyperlipidemia, unspecified: Secondary | ICD-10-CM | POA: Diagnosis not present

## 2024-07-13 DIAGNOSIS — B9789 Other viral agents as the cause of diseases classified elsewhere: Secondary | ICD-10-CM | POA: Diagnosis not present

## 2024-07-13 DIAGNOSIS — Z7982 Long term (current) use of aspirin: Secondary | ICD-10-CM | POA: Diagnosis not present

## 2024-07-13 DIAGNOSIS — Z87891 Personal history of nicotine dependence: Secondary | ICD-10-CM | POA: Diagnosis not present

## 2024-07-13 DIAGNOSIS — Z8249 Family history of ischemic heart disease and other diseases of the circulatory system: Secondary | ICD-10-CM | POA: Diagnosis not present

## 2024-07-13 DIAGNOSIS — Z955 Presence of coronary angioplasty implant and graft: Secondary | ICD-10-CM | POA: Diagnosis not present

## 2024-07-13 DIAGNOSIS — Z882 Allergy status to sulfonamides status: Secondary | ICD-10-CM | POA: Diagnosis not present

## 2024-07-13 DIAGNOSIS — J441 Chronic obstructive pulmonary disease with (acute) exacerbation: Secondary | ICD-10-CM

## 2024-07-13 DIAGNOSIS — I2510 Atherosclerotic heart disease of native coronary artery without angina pectoris: Secondary | ICD-10-CM | POA: Diagnosis not present

## 2024-07-13 DIAGNOSIS — Z1152 Encounter for screening for COVID-19: Secondary | ICD-10-CM | POA: Diagnosis not present

## 2024-07-13 DIAGNOSIS — E875 Hyperkalemia: Secondary | ICD-10-CM | POA: Diagnosis not present

## 2024-07-13 DIAGNOSIS — I10 Essential (primary) hypertension: Secondary | ICD-10-CM | POA: Diagnosis not present

## 2024-07-13 DIAGNOSIS — Z7902 Long term (current) use of antithrombotics/antiplatelets: Secondary | ICD-10-CM | POA: Diagnosis not present

## 2024-07-13 LAB — BASIC METABOLIC PANEL WITH GFR
Anion gap: 9 (ref 5–15)
BUN: 38 mg/dL — ABNORMAL HIGH (ref 8–23)
CO2: 24 mmol/L (ref 22–32)
Calcium: 9.2 mg/dL (ref 8.9–10.3)
Chloride: 108 mmol/L (ref 98–111)
Creatinine, Ser: 1.08 mg/dL (ref 0.61–1.24)
GFR, Estimated: >60 mL/min (ref >60)
Glucose, Bld: 138 mg/dL — ABNORMAL HIGH (ref 70–99)
Potassium: 5.3 mmol/L — ABNORMAL HIGH (ref 3.5–5.1)
Sodium: 141 mmol/L (ref 135–145)

## 2024-07-13 LAB — CBC
HCT: 39.6 % (ref 39.0–52.0)
Hemoglobin: 12.7 g/dL — ABNORMAL LOW (ref 13.0–17.0)
MCH: 30.5 pg (ref 26.0–34.0)
MCHC: 32.1 g/dL (ref 30.0–36.0)
MCV: 95.2 fL (ref 80.0–100.0)
Platelets: 168 K/uL (ref 150–400)
RBC: 4.16 MIL/uL — ABNORMAL LOW (ref 4.22–5.81)
RDW: 14.5 % (ref 11.5–15.5)
WBC: 19.1 K/uL — ABNORMAL HIGH (ref 4.0–10.5)
nRBC: 0 % (ref 0.0–0.2)

## 2024-07-13 LAB — MAGNESIUM: Magnesium: 2 mg/dL (ref 1.7–2.4)

## 2024-07-13 LAB — POTASSIUM: Potassium: 4.4 mmol/L (ref 3.5–5.1)

## 2024-07-13 MED ORDER — DOXYCYCLINE HYCLATE 100 MG PO TABS: 100.0000 mg | ORAL_TABLET | Freq: Two times a day (BID) | ORAL | 0 refills | Status: AC

## 2024-07-13 MED ORDER — SODIUM ZIRCONIUM CYCLOSILICATE 10 G PO PACK
10.0000 g | PACK | Freq: Once | ORAL | Status: AC
  Administered 2024-07-13: 10 g via ORAL
  Filled 2024-07-13: qty 1

## 2024-07-13 MED ORDER — HYDROXYZINE HCL 25 MG PO TABS
25.0000 mg | ORAL_TABLET | Freq: Once | ORAL | Status: AC
  Administered 2024-07-13: 25 mg via ORAL
  Filled 2024-07-13: qty 1

## 2024-07-13 MED ORDER — PREDNISONE 10 MG PO TABS: 40.0000 mg | ORAL_TABLET | Freq: Every day | ORAL | 0 refills | Status: AC

## 2024-07-13 MED ORDER — SODIUM ZIRCONIUM CYCLOSILICATE 10 G PO PACK: 10.0000 g | PACK | Freq: Once | ORAL | Status: DC

## 2024-07-13 NOTE — Progress Notes (Signed)
 Mobility Specialist Progress Note:    07/13/24 1005  Mobility  Activity Ambulated with assistance  Level of Assistance Modified independent, requires aide device or extra time  Assistive Device None  Distance Ambulated (ft) 70 ft  Range of Motion/Exercises Active;All extremities  Activity Response Tolerated well  Mobility Referral Yes  Mobility visit 1 Mobility  Mobility Specialist Start Time (ACUTE ONLY) 1005  Mobility Specialist Stop Time (ACUTE ONLY) 1025  Mobility Specialist Time Calculation (min) (ACUTE ONLY) 20 min   Pt received in chair, agreeable to mobility. ModI to stand and ambulate with no AD. Tolerated well, SpO2 92% on RA at rest. SpO2 89%-91% on RA during ambulation. Returned to chair, all needs met.  [name] Mobility Specialist Please contact via Special educational needs teacher or  Rehab office at [phone]

## 2024-07-13 NOTE — Progress Notes (Addendum)
 SATURATION QUALIFICATIONS: (This note is used to comply with regulatory documentation for home oxygen)  Patient Saturations on Room Air at Rest = 92%  Patient Saturations on Room Air while Ambulating = 89%  Patient Saturations on 2 Liters of oxygen while Ambulating = 93%  Please briefly explain why patient needs home oxygen:O2 sat drop with ambulation  Nathaneil Feagans, Cena Helling, RN

## 2024-07-13 NOTE — Plan of Care (Signed)
   Problem: Education: Goal: Knowledge of General Education information will improve Description Including pain rating scale, medication(s)/side effects and non-pharmacologic comfort measures Outcome: Progressing   Problem: Health Behavior/Discharge Planning: Goal: Ability to manage health-related needs will improve Outcome: Progressing

## 2024-07-13 NOTE — Care Management Obs Status (Signed)
 MEDICARE OBSERVATION STATUS NOTIFICATION   Patient Details  Name: [name] MRN: [id number] Date of Birth: 02/28/1951   Medicare Observation Status Notification Given:  Yes    Nena LITTIE Coffee, RN 07/13/2024, 9:50 AM

## 2024-07-13 NOTE — Progress Notes (Signed)
 PROGRESS NOTE    [name]  FMW:[id number] DOB: [birth year] DOA: 07/12/2024 PCP: Shona Norleen PEDLAR, MD   Brief Narrative:    [name] is a 73 y.o. male with medical history significant for tobacco abuse for many years, CAD with prior stents, hypertension, and dyslipidemia who presented to the ED with worsening shortness of breath as well as cough and phlegm production over the last several days to about a week.  Patient was admitted with acute COPD exacerbation and has required 2 L nasal cannula.  Assessment & Plan:   Principal Problem:   COPD with acute exacerbation (HCC)  Assessment and Plan:   Acute COPD exacerbation secondary to rhinovirus infection - DuoNebs every 6 hours - IV Solu-Medrol  twice daily - Pulmicort  twice daily - Continue on doxycycline  and given 1 dose of Augmentin  in ED - Procalcitonin low  Hyperkalemia -Given Lokelma , follow-up   CAD/dyslipidemia - Continue home medications   Hypertension - Continue home medications   History of tobacco abuse - Counseled on cessation   DVT prophylaxis: Lovenox  Code Status: Full Family Communication: Brother at bedside 8/29 Disposition Plan:  Status is: Observation The patient will require care spanning > 2 midnights and should be moved to inpatient because: Need for IV medications.  Consultants:  None  Procedures:  None  Antimicrobials:  Anti-infectives (From admission, onward)    Start     Dose/Rate Route Frequency Ordered Stop   07/13/24 0000  doxycycline  (VIBRA -TABS) 100 MG tablet        100 mg Oral Every 12 hours 07/13/24 1047 07/18/24 2359   07/12/24 1000  doxycycline  (VIBRA -TABS) tablet 100 mg        100 mg Oral Every 12 hours 07/12/24 0959     07/12/24 0845  amoxicillin -clavulanate (AUGMENTIN ) 875-125 MG per tablet 1 tablet        1 tablet Oral  Once 07/12/24 9167 07/12/24 0850       Subjective: Patient seen and evaluated today with no new acute complaints or concerns. No acute concerns or  events noted overnight.  He was eager for discharge and ambulated and was otherwise feeling well.  Later this afternoon, started to have more wheezing and shortness of breath and feels that he would benefit from staying another night.  Objective: Vitals:   07/13/24 0410 07/13/24 0748 07/13/24 0753 07/13/24 1340  BP: 120/81   134/76  Pulse: (!) 52   80  Resp: 18     Temp: 98.8 F (37.1 C)   97.9 F (36.6 C)  TempSrc: Oral   Oral  SpO2: 100% 98% 98% 96%  Weight:      Height:        Intake/Output Summary (Last 24 hours) at 07/13/2024 1343 Last data filed at 07/13/2024 0300 Gross per 24 hour  Intake 480 ml  Output --  Net 480 ml   Filed Weights   07/12/24 0555  Weight: 61 kg    Examination:  General exam: Appears calm and comfortable  Respiratory system: Clear to auscultation. Respiratory effort normal.  2 L nasal cannula. Cardiovascular system: S1 & S2 heard, RRR.  Gastrointestinal system: Abdomen is soft Central nervous system: Alert and awake Extremities: No edema Skin: No significant lesions noted Psychiatry: Flat affect.    Data Reviewed: I have personally reviewed following labs and imaging studies  CBC: Recent Labs  Lab 07/12/24 0557 07/13/24 0406  WBC 18.3* 19.1*  NEUTROABS 16.9*  --   HGB 15.1 12.7*  HCT 47.1  39.6  MCV 95.7 95.2  PLT 200 168   Basic Metabolic Panel: Recent Labs  Lab 07/12/24 0557 07/13/24 0406  NA 144 141  K 3.9 5.3*  CL 107 108  CO2 20* 24  GLUCOSE 129* 138*  BUN 20 38*  CREATININE 1.02 1.08  CALCIUM  9.4 9.2  MG  --  2.0   GFR: Estimated Creatinine Clearance: 51.8 mL/min (by C-G formula based on SCr of 1.08 mg/dL). Liver Function Tests: No results for input(s): AST, ALT, ALKPHOS, BILITOT, PROT, ALBUMIN in the last 168 hours. No results for input(s): LIPASE, AMYLASE in the last 168 hours. No results for input(s): AMMONIA in the last 168 hours. Coagulation Profile: No results for input(s): INR,  PROTIME in the last 168 hours. Cardiac Enzymes: No results for input(s): CKTOTAL, CKMB, CKMBINDEX, TROPONINI in the last 168 hours. BNP (last 3 results) No results for input(s): PROBNP in the last 8760 hours. HbA1C: No results for input(s): HGBA1C in the last 72 hours. CBG: No results for input(s): GLUCAP in the last 168 hours. Lipid Profile: No results for input(s): CHOL, HDL, LDLCALC, TRIG, CHOLHDL, LDLDIRECT in the last 72 hours. Thyroid  Function Tests: No results for input(s): TSH, T4TOTAL, FREET4, T3FREE, THYROIDAB in the last 72 hours. Anemia Panel: No results for input(s): VITAMINB12, FOLATE, FERRITIN, TIBC, IRON, RETICCTPCT in the last 72 hours. Sepsis Labs: Recent Labs  Lab 07/12/24 0745  PROCALCITON <0.10    Recent Results (from the past 240 hours)  Resp panel by RT-PCR (RSV, Flu A&B, Covid) Anterior Nasal Swab     Status: None   Collection Time: 07/12/24  6:06 AM   Specimen: Anterior Nasal Swab  Result Value Ref Range Status   SARS Coronavirus 2 by RT PCR NEGATIVE NEGATIVE Final    Comment: (NOTE) SARS-CoV-2 target nucleic acids are NOT DETECTED.  The SARS-CoV-2 RNA is generally detectable in upper respiratory specimens during the acute phase of infection. The lowest concentration of SARS-CoV-2 viral copies this assay can detect is 138 copies/mL. A negative result does not preclude SARS-Cov-2 infection and should not be used as the sole basis for treatment or other patient management decisions. A negative result may occur with  improper specimen collection/handling, submission of specimen other than nasopharyngeal swab, presence of viral mutation(s) within the areas targeted by this assay, and inadequate number of viral copies(<138 copies/mL). A negative result must be combined with clinical observations, patient history, and epidemiological information. The expected result is Negative.  Fact Sheet for Patients:   BloggerCourse.com  Fact Sheet for Healthcare Providers:  SeriousBroker.it  This test is no t yet approved or cleared by the [place]  FDA and  has been authorized for detection and/or diagnosis of SARS-CoV-2 by FDA under an Emergency Use Authorization (EUA). This EUA will remain  in effect (meaning this test can be used) for the duration of the COVID-19 declaration under Section 564(b)(1) of the Act, 21 U.S.C.section 360bbb-3(b)(1), unless the authorization is terminated  or revoked sooner.       Influenza A by PCR NEGATIVE NEGATIVE Final   Influenza B by PCR NEGATIVE NEGATIVE Final    Comment: (NOTE) The Xpert Xpress SARS-CoV-2/FLU/RSV plus assay is intended as an aid in the diagnosis of influenza from Nasopharyngeal swab specimens and should not be used as a sole basis for treatment. Nasal washings and aspirates are unacceptable for Xpert Xpress SARS-CoV-2/FLU/RSV testing.  Fact Sheet for Patients: BloggerCourse.com  Fact Sheet for Healthcare Providers: SeriousBroker.it  This test is not yet approved  or cleared by the [place]  FDA and has been authorized for detection and/or diagnosis of SARS-CoV-2 by FDA under an Emergency Use Authorization (EUA). This EUA will remain in effect (meaning this test can be used) for the duration of the COVID-19 declaration under Section 564(b)(1) of the Act, 21 U.S.C. section 360bbb-3(b)(1), unless the authorization is terminated or revoked.     Resp Syncytial Virus by PCR NEGATIVE NEGATIVE Final    Comment: (NOTE) Fact Sheet for Patients: BloggerCourse.com  Fact Sheet for Healthcare Providers: SeriousBroker.it  This test is not yet approved or cleared by the [place]  FDA and has been authorized for detection and/or diagnosis of SARS-CoV-2 by FDA under an Emergency Use  Authorization (EUA). This EUA will remain in effect (meaning this test can be used) for the duration of the COVID-19 declaration under Section 564(b)(1) of the Act, 21 U.S.C. section 360bbb-3(b)(1), unless the authorization is terminated or revoked.  Performed at [facility], [address], [place], [place] [address]   Respiratory (~20 pathogens) panel by PCR     Status: Abnormal   Collection Time: 07/12/24 11:11 AM   Specimen: Nasopharyngeal Swab; Respiratory  Result Value Ref Range Status   Adenovirus NOT DETECTED NOT DETECTED Final   Coronavirus 229E NOT DETECTED NOT DETECTED Final    Comment: (NOTE) The Coronavirus on the Respiratory Panel, DOES NOT test for the novel  Coronavirus (2019 nCoV)    Coronavirus HKU1 NOT DETECTED NOT DETECTED Final   Coronavirus NL63 NOT DETECTED NOT DETECTED Final   Coronavirus OC43 NOT DETECTED NOT DETECTED Final   Metapneumovirus NOT DETECTED NOT DETECTED Final   Rhinovirus / Enterovirus DETECTED (A) NOT DETECTED Final   Influenza A NOT DETECTED NOT DETECTED Final   Influenza B NOT DETECTED NOT DETECTED Final   Parainfluenza Virus 1 NOT DETECTED NOT DETECTED Final   Parainfluenza Virus 2 NOT DETECTED NOT DETECTED Final   Parainfluenza Virus 3 NOT DETECTED NOT DETECTED Final   Parainfluenza Virus 4 NOT DETECTED NOT DETECTED Final   Respiratory Syncytial Virus NOT DETECTED NOT DETECTED Final   Bordetella pertussis NOT DETECTED NOT DETECTED Final   Bordetella Parapertussis NOT DETECTED NOT DETECTED Final   Chlamydophila pneumoniae NOT DETECTED NOT DETECTED Final   Mycoplasma pneumoniae NOT DETECTED NOT DETECTED Final    Comment: Performed at [facility], [address], [place], [place] [address]         Radiology Studies: DG Chest Port 1 View Result Date: 07/12/2024 EXAM: 1 VIEW XRAY OF THE CHEST 07/12/2024 06:27:00 AM COMPARISON: 03/08/2018 CLINICAL HISTORY: SOB, cough. Cc of SOB worse last night. Went to Dr Milford office Monday  and was given antibiotics for his URI but isnt feeling any better. C/o SOB with exertion. Labored breathing. FINDINGS: LUNGS AND PLEURA: Hyperinflation. Emphysema. No focal pulmonary opacity. No pulmonary edema. No pleural effusion. No pneumothorax. HEART AND MEDIASTINUM: No acute abnormality of the cardiac and mediastinal silhouettes. BONES AND SOFT TISSUES: No acute osseous abnormality. IMPRESSION: 1. No acute findings. 2. Hyperinflation and emphysema. Electronically signed by: Waddell Calk MD 07/12/2024 07:07 AM EDT RP Workstation: [id number]        Scheduled Meds:  amLODipine   10 mg Oral Daily   aspirin  EC  81 mg Oral Daily   atorvastatin   10 mg Oral Daily   budesonide  (PULMICORT ) nebulizer solution  0.25 mg Nebulization BID   carvedilol   6.25 mg Oral BID WC   clopidogrel   75 mg Oral Daily   doxycycline   100  mg Oral Q12H   enoxaparin  (LOVENOX ) injection  40 mg Subcutaneous Q24H   feeding supplement  237 mL Oral BID BM   ipratropium-albuterol   3 mL Nebulization Q6H   losartan   50 mg Oral Daily   methylPREDNISolone  (SOLU-MEDROL ) injection  40 mg Intravenous Q12H     LOS: 0 days    Time spent: 55 minutes    Nekhi Liwanag JONETTA Fairly, DO [facility]  If 7PM-7AM, please contact night-coverage [email or web address] 07/13/2024, 1:43 PM

## 2024-07-14 DIAGNOSIS — J441 Chronic obstructive pulmonary disease with (acute) exacerbation: Secondary | ICD-10-CM | POA: Diagnosis not present

## 2024-07-14 LAB — BASIC METABOLIC PANEL WITH GFR
Anion gap: 12 (ref 5–15)
BUN: 47 mg/dL — ABNORMAL HIGH (ref 8–23)
CO2: 24 mmol/L (ref 22–32)
Calcium: 9.5 mg/dL (ref 8.9–10.3)
Chloride: 108 mmol/L (ref 98–111)
Creatinine, Ser: 1.17 mg/dL (ref 0.61–1.24)
GFR, Estimated: >60 mL/min (ref >60)
Glucose, Bld: 133 mg/dL — ABNORMAL HIGH (ref 70–99)
Potassium: 4.4 mmol/L (ref 3.5–5.1)
Sodium: 144 mmol/L (ref 135–145)

## 2024-07-14 LAB — CBC
HCT: 42.1 % (ref 39.0–52.0)
Hemoglobin: 13.5 g/dL (ref 13.0–17.0)
MCH: 30.3 pg (ref 26.0–34.0)
MCHC: 32.1 g/dL (ref 30.0–36.0)
MCV: 94.6 fL (ref 80.0–100.0)
Platelets: 199 K/uL (ref 150–400)
RBC: 4.45 MIL/uL (ref 4.22–5.81)
RDW: 14.5 % (ref 11.5–15.5)
WBC: 18.6 K/uL — ABNORMAL HIGH (ref 4.0–10.5)
nRBC: 0 % (ref 0.0–0.2)

## 2024-07-14 LAB — MAGNESIUM: Magnesium: 2 mg/dL (ref 1.7–2.4)

## 2024-07-14 MED ORDER — ALBUTEROL SULFATE HFA 108 (90 BASE) MCG/ACT IN AERS
2.0000 | INHALATION_SPRAY | RESPIRATORY_TRACT | 2 refills | Status: AC | PRN
Start: 2024-07-14 — End: ?

## 2024-07-14 MED ORDER — BUDESONIDE-FORMOTEROL FUMARATE 80-4.5 MCG/ACT IN AERO: 2.0000 | INHALATION_SPRAY | Freq: Two times a day (BID) | RESPIRATORY_TRACT | 12 refills | Status: AC

## 2024-07-14 MED ORDER — HYDROXYZINE HCL 25 MG PO TABS: 25.0000 mg | ORAL_TABLET | Freq: Three times a day (TID) | ORAL | 0 refills | Status: AC | PRN

## 2024-07-14 NOTE — Plan of Care (Signed)

## 2024-07-14 NOTE — Discharge Summary (Signed)
 Physician Discharge Summary  [name] FMW:[id number] DOB: 1951/11/11 DOA: 07/12/2024  PCP: Shona Norleen PEDLAR, MD  Admit date: 07/12/2024  Discharge date: 07/14/2024  Admitted From:Home  Disposition:  Home  Recommendations for Outpatient Follow-up:  Follow up with PCP in 1-2 weeks Follow-up with pulmonology outpatient with referral sent Continue on breathing treatments as needed for shortness of breath or wheezing Continue prednisone  and doxycycline  as prescribed to complete course of treatment for 5 days Atarax  prescribed as needed for any anxiety symptoms and appears to have benefited him during this hospitalization Counseled on smoking cessation Okay to continue other home medications  Home Health: None  Equipment/Devices: None  Discharge Condition:Stable  CODE STATUS: Full  Diet recommendation: Heart Healthy  Brief/Interim Summary: [name] is a 72 y.o. male with medical history significant for tobacco abuse for many years, CAD with prior stents, hypertension, and dyslipidemia who presented to the ED with worsening shortness of breath as well as cough and phlegm production over the last several days to about a week.  Patient was admitted with acute COPD exacerbation and has required 2 L nasal cannula.  He was noted to have respiratory panel positive for rhinovirus, but no other signs of pneumonia noted.  He has responded well to breathing treatments and IV steroids.  He no longer has any oxygen requirement and is otherwise in stable condition for discharge.  Discharge Diagnoses:  Principal Problem:   COPD with acute exacerbation (HCC)  Principal discharge diagnosis: Acute COPD exacerbation secondary to rhinovirus infection.  Discharge Instructions  Discharge Instructions     Diet - low sodium heart healthy   Complete by: As directed    Diet - low sodium heart healthy   Complete by: As directed    Increase activity slowly   Complete by: As directed    Increase  activity slowly   Complete by: As directed    Pulmonary Visit   Complete by: As directed    COPD   Reason for referral: Other Pulmonary      Allergies as of 07/14/2024       Reactions   Sulfa Antibiotics Hives        Medication List     STOP taking these medications    azithromycin 250 MG tablet Commonly known as: ZITHROMAX   methylPREDNISolone  4 MG Tbpk tablet Commonly known as: MEDROL  DOSEPAK       TAKE these medications    albuterol  108 (90 Base) MCG/ACT inhaler Commonly known as: VENTOLIN  HFA Inhale 2 puffs into the lungs every 4 (four) hours as needed for wheezing or shortness of breath.   amLODipine  10 MG tablet Commonly known as: NORVASC  TAKE 1 TABLET BY MOUTH EVERY DAY   aspirin  EC 81 MG tablet Take 1 tablet (81 mg total) by mouth daily.   atorvastatin  10 MG tablet Commonly known as: LIPITOR  Take 10 mg by mouth daily.   benzonatate 200 MG capsule Commonly known as: TESSALON Take 200 mg by mouth 3 (three) times daily as needed for cough.   budesonide -formoterol  80-4.5 MCG/ACT inhaler Commonly known as: SYMBICORT  Inhale 2 puffs into the lungs in the morning and at bedtime.   carvedilol  6.25 MG tablet Commonly known as: COREG  Take 1 tablet (6.25 mg total) by mouth 2 (two) times daily.   cloNIDine  0.1 MG tablet Commonly known as: CATAPRES  Take 0.1 mg by mouth 2 (two) times daily.   clopidogrel  75 MG tablet Commonly known as: PLAVIX  TAKE 1 TABLET BY MOUTH EVERY  DAY   doxycycline  100 MG tablet Commonly known as: VIBRA -TABS Take 1 tablet (100 mg total) by mouth every 12 (twelve) hours for 5 days.   hydrOXYzine  25 MG tablet Commonly known as: ATARAX  Take 1 tablet (25 mg total) by mouth 3 (three) times daily as needed for anxiety.   losartan  50 MG tablet Commonly known as: COZAAR  TAKE 1 TABLET BY MOUTH EVERY DAY   predniSONE  10 MG tablet Commonly known as: DELTASONE  Take 4 tablets (40 mg total) by mouth daily for 5 days. What changed:   medication strength how much to take when to take this   sildenafil  20 MG tablet Commonly known as: REVATIO  TAKE 2 TO 3 TABLETS BY MOUTH DAILY AS NEEDED What changed: See the new instructions.               Durable Medical Equipment  (From admission, onward)           Start     Ordered   07/13/24 1408  For home use only DME Nebulizer machine  Once       Question Answer Comment  Patient needs a nebulizer to treat with the following condition COPD with acute exacerbation (HCC)   Length of Need Lifetime   Additional equipment included Administration kit   Additional equipment included Filter      07/13/24 1407            Follow-up Information     Shona Norleen PEDLAR, MD. Schedule an appointment as soon as possible for a visit in 1 week(s).   Specialty: Internal Medicine Contact information: [address] [place] [address] [phone]         [facility]. Go to.   Specialty: Pulmonology Contact information: [address] [place]  [address] [phone] Additional information: [address]  [address]  [place], [place] [address]               Allergies  Allergen Reactions   Sulfa Antibiotics Hives    Consultations: None   Procedures/Studies: DG Chest Port 1 View Result Date: 07/12/2024 EXAM: 1 VIEW XRAY OF THE CHEST 07/12/2024 06:27:00 AM COMPARISON: 03/08/2018 CLINICAL HISTORY: SOB, cough. Cc of SOB worse last night. Went to Dr Milford office Monday and was given antibiotics for his URI but isnt feeling any better. C/o SOB with exertion. Labored breathing. FINDINGS: LUNGS AND PLEURA: Hyperinflation. Emphysema. No focal pulmonary opacity. No pulmonary edema. No pleural effusion. No pneumothorax. HEART AND MEDIASTINUM: No acute abnormality of the cardiac and mediastinal silhouettes. BONES AND SOFT TISSUES: No acute osseous abnormality. IMPRESSION: 1. No acute findings. 2.  Hyperinflation and emphysema. Electronically signed by: Waddell Calk MD 07/12/2024 07:07 AM EDT RP Workstation: [id number]     Discharge Exam: Vitals:   07/14/24 0753 07/14/24 0757  BP:    Pulse:    Resp:    Temp:    SpO2: 94% 94%   Vitals:   07/14/24 0715 07/14/24 0720 07/14/24 0753 07/14/24 0757  BP:      Pulse:      Resp:      Temp:      TempSrc:      SpO2: 95% 95% 94% 94%  Weight:      Height:        General: Pt is alert, awake, not in acute distress Cardiovascular: RRR, S1/S2 +, no rubs, no gallops Respiratory: CTA bilaterally, no wheezing, no rhonchi Abdominal: Soft, NT,  ND, bowel sounds + Extremities: no edema, no cyanosis    The results of significant diagnostics from this hospitalization (including imaging, microbiology, ancillary and laboratory) are listed below for reference.     Microbiology: Recent Results (from the past 240 hours)  Resp panel by RT-PCR (RSV, Flu A&B, Covid) Anterior Nasal Swab     Status: None   Collection Time: 07/12/24  6:06 AM   Specimen: Anterior Nasal Swab  Result Value Ref Range Status   SARS Coronavirus 2 by RT PCR NEGATIVE NEGATIVE Final    Comment: (NOTE) SARS-CoV-2 target nucleic acids are NOT DETECTED.  The SARS-CoV-2 RNA is generally detectable in upper respiratory specimens during the acute phase of infection. The lowest concentration of SARS-CoV-2 viral copies this assay can detect is 138 copies/mL. A negative result does not preclude SARS-Cov-2 infection and should not be used as the sole basis for treatment or other patient management decisions. A negative result may occur with  improper specimen collection/handling, submission of specimen other than nasopharyngeal swab, presence of viral mutation(s) within the areas targeted by this assay, and inadequate number of viral copies(<138 copies/mL). A negative result must be combined with clinical observations, patient history, and epidemiological information. The  expected result is Negative.  Fact Sheet for Patients:  BloggerCourse.com  Fact Sheet for Healthcare Providers:  SeriousBroker.it  This test is no t yet approved or cleared by the [place]  FDA and  has been authorized for detection and/or diagnosis of SARS-CoV-2 by FDA under an Emergency Use Authorization (EUA). This EUA will remain  in effect (meaning this test can be used) for the duration of the COVID-19 declaration under Section 564(b)(1) of the Act, 21 U.S.C.section 360bbb-3(b)(1), unless the authorization is terminated  or revoked sooner.       Influenza A by PCR NEGATIVE NEGATIVE Final   Influenza B by PCR NEGATIVE NEGATIVE Final    Comment: (NOTE) The Xpert Xpress SARS-CoV-2/FLU/RSV plus assay is intended as an aid in the diagnosis of influenza from Nasopharyngeal swab specimens and should not be used as a sole basis for treatment. Nasal washings and aspirates are unacceptable for Xpert Xpress SARS-CoV-2/FLU/RSV testing.  Fact Sheet for Patients: BloggerCourse.com  Fact Sheet for Healthcare Providers: SeriousBroker.it  This test is not yet approved or cleared by the [place]  FDA and has been authorized for detection and/or diagnosis of SARS-CoV-2 by FDA under an Emergency Use Authorization (EUA). This EUA will remain in effect (meaning this test can be used) for the duration of the COVID-19 declaration under Section 564(b)(1) of the Act, 21 U.S.C. section 360bbb-3(b)(1), unless the authorization is terminated or revoked.     Resp Syncytial Virus by PCR NEGATIVE NEGATIVE Final    Comment: (NOTE) Fact Sheet for Patients: BloggerCourse.com  Fact Sheet for Healthcare Providers: SeriousBroker.it  This test is not yet approved or cleared by the [place]  FDA and has been authorized for detection and/or  diagnosis of SARS-CoV-2 by FDA under an Emergency Use Authorization (EUA). This EUA will remain in effect (meaning this test can be used) for the duration of the COVID-19 declaration under Section 564(b)(1) of the Act, 21 U.S.C. section 360bbb-3(b)(1), unless the authorization is terminated or revoked.  Performed at [facility], [address] [place], [place] [address]   Respiratory (~20 pathogens) panel by PCR     Status: Abnormal   Collection Time: 07/12/24 11:11 AM   Specimen: Nasopharyngeal Swab; Respiratory  Result Value Ref Range Status   Adenovirus NOT DETECTED NOT  DETECTED Final   Coronavirus 229E NOT DETECTED NOT DETECTED Final    Comment: (NOTE) The Coronavirus on the Respiratory Panel, DOES NOT test for the novel  Coronavirus (2019 nCoV)    Coronavirus HKU1 NOT DETECTED NOT DETECTED Final   Coronavirus NL63 NOT DETECTED NOT DETECTED Final   Coronavirus OC43 NOT DETECTED NOT DETECTED Final   Metapneumovirus NOT DETECTED NOT DETECTED Final   Rhinovirus / Enterovirus DETECTED (A) NOT DETECTED Final   Influenza A NOT DETECTED NOT DETECTED Final   Influenza B NOT DETECTED NOT DETECTED Final   Parainfluenza Virus 1 NOT DETECTED NOT DETECTED Final   Parainfluenza Virus 2 NOT DETECTED NOT DETECTED Final   Parainfluenza Virus 3 NOT DETECTED NOT DETECTED Final   Parainfluenza Virus 4 NOT DETECTED NOT DETECTED Final   Respiratory Syncytial Virus NOT DETECTED NOT DETECTED Final   Bordetella pertussis NOT DETECTED NOT DETECTED Final   Bordetella Parapertussis NOT DETECTED NOT DETECTED Final   Chlamydophila pneumoniae NOT DETECTED NOT DETECTED Final   Mycoplasma pneumoniae NOT DETECTED NOT DETECTED Final    Comment: Performed at [facility], [address], [place], [place] [address]     Labs: BNP (last 3 results) Recent Labs    07/12/24 0557  BNP 93.0   Basic Metabolic Panel: Recent Labs  Lab 07/12/24 0557 07/13/24 0406 07/13/24 1337 07/14/24 0457  NA 144  141  --  144  K 3.9 5.3* 4.4 4.4  CL 107 108  --  108  CO2 20* 24  --  24  GLUCOSE 129* 138*  --  133*  BUN 20 38*  --  47*  CREATININE 1.02 1.08  --  1.17  CALCIUM  9.4 9.2  --  9.5  MG  --  2.0  --  2.0   Liver Function Tests: No results for input(s): AST, ALT, ALKPHOS, BILITOT, PROT, ALBUMIN in the last 168 hours. No results for input(s): LIPASE, AMYLASE in the last 168 hours. No results for input(s): AMMONIA in the last 168 hours. CBC: Recent Labs  Lab 07/12/24 0557 07/13/24 0406 07/14/24 0457  WBC 18.3* 19.1* 18.6*  NEUTROABS 16.9*  --   --   HGB 15.1 12.7* 13.5  HCT 47.1 39.6 42.1  MCV 95.7 95.2 94.6  PLT 200 168 199   Cardiac Enzymes: No results for input(s): CKTOTAL, CKMB, CKMBINDEX, TROPONINI in the last 168 hours. BNP: Invalid input(s): POCBNP CBG: No results for input(s): GLUCAP in the last 168 hours. D-Dimer No results for input(s): DDIMER in the last 72 hours. Hgb A1c No results for input(s): HGBA1C in the last 72 hours. Lipid Profile No results for input(s): CHOL, HDL, LDLCALC, TRIG, CHOLHDL, LDLDIRECT in the last 72 hours. Thyroid  function studies No results for input(s): TSH, T4TOTAL, T3FREE, THYROIDAB in the last 72 hours.  Invalid input(s): FREET3 Anemia work up No results for input(s): VITAMINB12, FOLATE, FERRITIN, TIBC, IRON, RETICCTPCT in the last 72 hours. Urinalysis    Component Value Date/Time   COLORURINE YELLOW 01/24/2016 2028   APPEARANCEUR CLEAR 01/24/2016 2028   LABSPEC 1.020 01/24/2016 2028   PHURINE 5.5 01/24/2016 2028   GLUCOSEU NEGATIVE 01/24/2016 2028   HGBUR MODERATE (A) 01/24/2016 2028   BILIRUBINUR NEGATIVE 01/24/2016 2028   KETONESUR TRACE (A) 01/24/2016 2028   PROTEINUR 30 (A) 01/24/2016 2028   NITRITE NEGATIVE 01/24/2016 2028   LEUKOCYTESUR NEGATIVE 01/24/2016 2028   Sepsis Labs Recent Labs  Lab 07/12/24 0557 07/13/24 0406 07/14/24 0457  WBC  18.3* 19.1* 18.6*  Microbiology Recent Results (from the past 240 hours)  Resp panel by RT-PCR (RSV, Flu A&B, Covid) Anterior Nasal Swab     Status: None   Collection Time: 07/12/24  6:06 AM   Specimen: Anterior Nasal Swab  Result Value Ref Range Status   SARS Coronavirus 2 by RT PCR NEGATIVE NEGATIVE Final    Comment: (NOTE) SARS-CoV-2 target nucleic acids are NOT DETECTED.  The SARS-CoV-2 RNA is generally detectable in upper respiratory specimens during the acute phase of infection. The lowest concentration of SARS-CoV-2 viral copies this assay can detect is 138 copies/mL. A negative result does not preclude SARS-Cov-2 infection and should not be used as the sole basis for treatment or other patient management decisions. A negative result may occur with  improper specimen collection/handling, submission of specimen other than nasopharyngeal swab, presence of viral mutation(s) within the areas targeted by this assay, and inadequate number of viral copies(<138 copies/mL). A negative result must be combined with clinical observations, patient history, and epidemiological information. The expected result is Negative.  Fact Sheet for Patients:  BloggerCourse.com  Fact Sheet for Healthcare Providers:  SeriousBroker.it  This test is no t yet approved or cleared by the [place]  FDA and  has been authorized for detection and/or diagnosis of SARS-CoV-2 by FDA under an Emergency Use Authorization (EUA). This EUA will remain  in effect (meaning this test can be used) for the duration of the COVID-19 declaration under Section 564(b)(1) of the Act, 21 U.S.C.section 360bbb-3(b)(1), unless the authorization is terminated  or revoked sooner.       Influenza A by PCR NEGATIVE NEGATIVE Final   Influenza B by PCR NEGATIVE NEGATIVE Final    Comment: (NOTE) The Xpert Xpress SARS-CoV-2/FLU/RSV plus assay is intended as an aid in the  diagnosis of influenza from Nasopharyngeal swab specimens and should not be used as a sole basis for treatment. Nasal washings and aspirates are unacceptable for Xpert Xpress SARS-CoV-2/FLU/RSV testing.  Fact Sheet for Patients: BloggerCourse.com  Fact Sheet for Healthcare Providers: SeriousBroker.it  This test is not yet approved or cleared by the [place]  FDA and has been authorized for detection and/or diagnosis of SARS-CoV-2 by FDA under an Emergency Use Authorization (EUA). This EUA will remain in effect (meaning this test can be used) for the duration of the COVID-19 declaration under Section 564(b)(1) of the Act, 21 U.S.C. section 360bbb-3(b)(1), unless the authorization is terminated or revoked.     Resp Syncytial Virus by PCR NEGATIVE NEGATIVE Final    Comment: (NOTE) Fact Sheet for Patients: BloggerCourse.com  Fact Sheet for Healthcare Providers: SeriousBroker.it  This test is not yet approved or cleared by the [place]  FDA and has been authorized for detection and/or diagnosis of SARS-CoV-2 by FDA under an Emergency Use Authorization (EUA). This EUA will remain in effect (meaning this test can be used) for the duration of the COVID-19 declaration under Section 564(b)(1) of the Act, 21 U.S.C. section 360bbb-3(b)(1), unless the authorization is terminated or revoked.  Performed at [facility], [address], [place], [place] [address]   Respiratory (~20 pathogens) panel by PCR     Status: Abnormal   Collection Time: 07/12/24 11:11 AM   Specimen: Nasopharyngeal Swab; Respiratory  Result Value Ref Range Status   Adenovirus NOT DETECTED NOT DETECTED Final   Coronavirus 229E NOT DETECTED NOT DETECTED Final    Comment: (NOTE) The Coronavirus on the Respiratory Panel, DOES NOT test for the novel  Coronavirus (2019 nCoV)    Coronavirus  HKU1 NOT DETECTED NOT  DETECTED Final   Coronavirus NL63 NOT DETECTED NOT DETECTED Final   Coronavirus OC43 NOT DETECTED NOT DETECTED Final   Metapneumovirus NOT DETECTED NOT DETECTED Final   Rhinovirus / Enterovirus DETECTED (A) NOT DETECTED Final   Influenza A NOT DETECTED NOT DETECTED Final   Influenza B NOT DETECTED NOT DETECTED Final   Parainfluenza Virus 1 NOT DETECTED NOT DETECTED Final   Parainfluenza Virus 2 NOT DETECTED NOT DETECTED Final   Parainfluenza Virus 3 NOT DETECTED NOT DETECTED Final   Parainfluenza Virus 4 NOT DETECTED NOT DETECTED Final   Respiratory Syncytial Virus NOT DETECTED NOT DETECTED Final   Bordetella pertussis NOT DETECTED NOT DETECTED Final   Bordetella Parapertussis NOT DETECTED NOT DETECTED Final   Chlamydophila pneumoniae NOT DETECTED NOT DETECTED Final   Mycoplasma pneumoniae NOT DETECTED NOT DETECTED Final    Comment: Performed at [facility], [address] [place], [place] [address]     Time coordinating discharge: 35 minutes  SIGNED:   Adron JONETTA Fairly, DO [facility] 07/14/2024, 9:15 AM  If 7PM-7AM, please contact night-coverage [email or web address]

## 2024-07-17 ENCOUNTER — Telehealth: Payer: Self-pay | Admitting: *Deleted

## 2024-07-17 NOTE — Transitions of Care (Post Inpatient/ED Visit) (Signed)
   07/17/2024  Name: [name] MRN: [id number] DOB: 1951-06-01  Today's TOC FU Call Status: Today's TOC FU Call Status:: Unsuccessful Call (1st Attempt) Unsuccessful Call (1st Attempt) Date: 07/17/24  Attempted to reach the patient regarding the most recent Inpatient/ED visit.  Follow Up Plan: Additional outreach attempts will be made to reach the patient to complete the Transitions of Care (Post Inpatient/ED visit) call.   Cathlean Headland BSN RN [facility] Health Care Management Coordinator [email or web address] Direct Dial: [phone]  Fax: [phone] Website: [email or web address]

## 2024-07-18 ENCOUNTER — Telehealth: Payer: Self-pay

## 2024-07-18 NOTE — Transitions of Care (Post Inpatient/ED Visit) (Signed)
   07/18/2024  Name: [name] MRN: [id number] DOB: 1951/08/23  Today's TOC FU Call Status: Today's TOC FU Call Status:: Unsuccessful Call (2nd Attempt) Unsuccessful Call (2nd Attempt) Date: 07/18/24  Attempted to reach the patient regarding the most recent Inpatient/ED visit.  Follow Up Plan: Additional outreach attempts will be made to reach the patient to complete the Transitions of Care (Post Inpatient/ED visit) call.   Alan Ee, RN, BSN, CEN [employer]- Transition of Care Team.  [facility] [phone]

## 2024-07-19 ENCOUNTER — Telehealth: Payer: Self-pay

## 2024-07-19 NOTE — Transitions of Care (Post Inpatient/ED Visit) (Signed)
   07/19/2024  Name: [name] MRN: [id number] DOB: [birth year]  Today's TOC FU Call Status: Today's TOC FU Call Status:: Unsuccessful Call (3rd Attempt) Unsuccessful Call (3rd Attempt) Date: 07/19/24  Attempted to reach the patient regarding the most recent Inpatient/ED visit.  Follow Up Plan: No further outreach attempts will be made at this time. We have been unable to contact the patient.  Amyriah Buras J. Alarik Radu RN, MSN [facility], [facility] RN Care Manager Direct Dial: [phone]  Fax: [phone] Website: [email or web address]

## 2024-07-23 ENCOUNTER — Other Ambulatory Visit: Payer: Self-pay | Admitting: *Deleted

## 2024-07-23 MED ORDER — CARVEDILOL 6.25 MG PO TABS: 6.2500 mg | ORAL_TABLET | Freq: Two times a day (BID) | ORAL | 3 refills | Status: AC

## 2024-07-23 NOTE — Telephone Encounter (Signed)
 Pt came into the office today and stated that [facility] still will not fill his RX for Carvedilol .

## 2024-07-23 NOTE — Telephone Encounter (Signed)
 I spoke with [facility] and they confirmed they received refill request and will fill, left message for patient to go pick up at pharmacy.

## 2024-07-25 DIAGNOSIS — Z09 Encounter for follow-up examination after completed treatment for conditions other than malignant neoplasm: Secondary | ICD-10-CM | POA: Diagnosis not present

## 2024-07-25 DIAGNOSIS — F419 Anxiety disorder, unspecified: Secondary | ICD-10-CM | POA: Diagnosis not present

## 2024-09-21 ENCOUNTER — Ambulatory Visit: Admitting: Internal Medicine

## 2024-09-21 NOTE — Progress Notes (Deleted)
 [name], male    DOB: 03-Feb-1951    MRN: [id number]   Brief patient profile:  73  yo*** *** referred to pulmonary clinic in [place]  09/21/2024 by *** for ***   Pt not previously seen by PCCM service.     History of Present Illness  09/21/2024  Pulmonary/ 1st office eval/ [facility] / [place] [facility]  No chief complaint on file.    Dyspnea:  *** Cough: *** Sleep: *** SABA use: *** 02: *** LDSCT:***  No obvious day to day or daytime pattern/variability or assoc excess/ purulent sputum or mucus plugs or hemoptysis or cp or chest tightness, subjective wheeze or overt sinus or hb symptoms.    Also denies any obvious fluctuation of symptoms with weather or environmental changes or other aggravating or alleviating factors except as outlined above   No unusual exposure hx or h/o childhood pna/ asthma or knowledge of premature birth.  Current Allergies, Complete Past Medical History, Past Surgical History, Family History, and Social History were reviewed in [employer] record.  ROS  The following are not active complaints unless bolded Hoarseness, sore throat, dysphagia, dental problems, itching, sneezing,  nasal congestion or discharge of excess mucus or purulent secretions, ear ache,   fever, chills, sweats, unintended wt loss or wt gain, classically pleuritic or exertional cp,  orthopnea pnd or arm/hand swelling  or leg swelling, presyncope, palpitations, abdominal pain, anorexia, nausea, vomiting, diarrhea  or change in bowel habits or change in bladder habits, change in stools or change in urine, dysuria, hematuria,  rash, arthralgias, visual complaints, headache, numbness, weakness or ataxia or problems with walking or coordination,  change in mood or  memory.            Outpatient Medications Prior to Visit  Medication Sig Dispense Refill   albuterol  (VENTOLIN  HFA) 108 (90 Base) MCG/ACT inhaler Inhale 2 puffs into the lungs every 4 (four) hours as  needed for wheezing or shortness of breath. 8 g 2   amLODipine  (NORVASC ) 10 MG tablet TAKE 1 TABLET BY MOUTH EVERY DAY 90 tablet 3   aspirin  EC 81 MG EC tablet Take 1 tablet (81 mg total) by mouth daily.     atorvastatin  (LIPITOR ) 10 MG tablet Take 10 mg by mouth daily.     benzonatate (TESSALON) 200 MG capsule Take 200 mg by mouth 3 (three) times daily as needed for cough.     budesonide -formoterol  (SYMBICORT ) 80-4.5 MCG/ACT inhaler Inhale 2 puffs into the lungs in the morning and at bedtime. 1 each 12   carvedilol  (COREG ) 6.25 MG tablet Take 1 tablet (6.25 mg total) by mouth 2 (two) times daily. 180 tablet 3   cloNIDine  (CATAPRES ) 0.1 MG tablet Take 0.1 mg by mouth 2 (two) times daily.     clopidogrel  (PLAVIX ) 75 MG tablet TAKE 1 TABLET BY MOUTH EVERY DAY 90 tablet 3   hydrOXYzine  (ATARAX ) 25 MG tablet Take 1 tablet (25 mg total) by mouth 3 (three) times daily as needed for anxiety. 30 tablet 0   losartan  (COZAAR ) 50 MG tablet TAKE 1 TABLET BY MOUTH EVERY DAY 90 tablet 3   sildenafil  (REVATIO ) 20 MG tablet TAKE 2 TO 3 TABLETS BY MOUTH DAILY AS NEEDED (Patient taking differently: Take 40-60 mg by mouth daily as needed (erectile dysfunction).) 30 tablet 1   No facility-administered medications prior to visit.    Past Medical History:  Diagnosis Date   Acute MI (HCC) 08/2012   CAD (coronary  artery disease)    a. 08/2012 Inferolateral STEMI/Cath/PCI: LM nl, LAD minor irregs, RI 20p, LCX nl, OM1 100d (Tx w 2.75x45mm Promus DES), RCA nondom, minor irregs, EF60-65%.   Chronic bronchitis (HCC)    Complication of anesthesia    difficulty waking up   Environmental allergies    Full dentures    GERD (gastroesophageal reflux disease)    History of gout X 1   History of hiatal hernia    History of kidney stones    HTN (hypertension) 03/10/2018   Hypertension    Inguinal hernia    right    NSTEMI (non-ST elevated myocardial infarction) (HCC) 03/08/2018   Pneumonia    as a baby   S/P  angioplasty with stent to mid to distal LCX with DES 03/08/18  03/10/2018   Tobacco abuse    Wears glasses       Objective:     There were no vitals taken for this visit.         Assessment

## 2024-10-19 ENCOUNTER — Encounter (HOSPITAL_COMMUNITY): Payer: Self-pay | Admitting: Surgery

## 2024-11-20 NOTE — Progress Notes (Addendum)
 " Cardiology Office Note   Date: 11/23/24 ID:  [name], DOB 02-17-1951, MRN [id number] PCP: Shona Norleen PEDLAR, MD  [facility] Providers Cardiologist:  Danelle Birmingham, MD Electrophysiologist:  Donnice DELENA Primus, MD   History of Present Illness [name] is a 74 y.o. male with SND, HTN, tobacco abuse, NSTEMI, CAD s/p L CX PCI with subsequent thrombotic stenosis requiring repeat PCI of the L CX, who presents for routine follow-up.  Discussed the use of AI scribe software for clinical note transcription with the patient, who gave verbal consent to proceed. History of Present Illness [name] is a 74 year old male with a history of myocardial infarction who presents for a cardiovascular follow-up and SND.   He has a history of myocardial infarction and underwent cardiac catheterization in 2019. He is on long-term dual antiplatelet therapy with aspirin  and clopidogrel . He takes carvedilol  and clonidine  for blood pressure management and reports no issues with these medications. The patient reports no symptoms of angina, arrhythmias, slow or fast heart rates, lightheadedness, or dizziness.  Two months ago, he experienced a 'spell' diagnosed as a rhinovirus infection, which he describes as significant but reports no ongoing symptoms related to this.  He has a history of smoking and currently smokes about one cigarette every day and a half. 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  ROS: no sx  Studies Reviewed  ECG review Result date: 12/01/22 NSR 62, PR 180, QRS 112, QT/c 420/426  Coronary angiography Result date: 03/08/18 Acute coronary syndrome with non-ST elevation myocardial infarction presentation due to thrombotic 99% stenosis in the mid to distal circumflex proximal to previous distal stent. Left  dominant coronary anatomy Normal left main 50% stenosis in the proximal to mid LAD.  Small first diagonal contains 70% ostial narrowing. Right coronary is nondominant and contains both acute marginal and distal disease of 70-85%. Successful PTCA and stent implantation overlapping the previously placed distal stent proximally.  Reduction in 99% stenosis with TIMI grade III flow to 0% with TIMI grade III flow.   Physical Exam VS:  BP 131/89 (BP Location: Right Arm, Cuff Size: Normal)   Pulse 62   Ht 5' 7 (1.702 m)   Wt 139 lb (63 kg)   SpO2 98%   BMI 21.77 kg/m   Wt Readings from Last 3 Encounters:  11/23/24 139 lb (63 kg)  07/12/24 134 lb 7.7 oz (61 kg)  08/30/23 133 lb (60.3 kg)    GEN: Well nourished, well developed in no acute distress NECK: No JVD; No carotid bruits CARDIAC: RRR, no murmurs, rubs, gallops RESPIRATORY: CTAB, wheezes bl  ABDOMEN: Soft, non-tender, non-distended EXTREMITIES:  No edema; No deformity   ASSESSMENT AND PLAN [name] is a 74 y.o. male with SND, HTN, tobacco abuse, NSTEMI, CAD s/p L CX PCI with subsequent thrombotic stenosis requiring repeat PCI of the L CX, who presents for routine follow-up. Assessment & Plan Coronary artery disease, post-myocardial infarction Coronary artery disease stable post-MI, no angina or arrhythmia. Medications well-tolerated, BP controlled. - Continue aspirin  and Plavix  as per 2019 catheterization recommendations. - Continue carvedilol  and clonidine  for BP management. - Follow-up every two years unless issues arise.  Hypertension Hypertension well-controlled with current regimen. - Continue current antihypertensive regimen including carvedilol  and clonidine .  Nicotine dependence, cigarettes Continues  smoking 1.5 cigarettes/day, advised to quit. - Encouraged reduction in smoking and eventual cessation.  Sinus node dysfunction - No issues today. Denies any lightheadedness.    Dispo: RTC 2  years  Signed, Donnice DELENA Primus, MD  "

## 2024-11-23 ENCOUNTER — Encounter: Payer: Self-pay | Admitting: Student in an Organized Health Care Education/Training Program

## 2024-11-23 ENCOUNTER — Ambulatory Visit
Attending: Student in an Organized Health Care Education/Training Program | Admitting: Student in an Organized Health Care Education/Training Program

## 2024-11-23 ENCOUNTER — Encounter: Payer: Self-pay | Admitting: *Deleted

## 2024-11-23 VITALS — BP 131/89 | HR 62 | Ht 67.0 in | Wt 139.0 lb

## 2024-11-23 DIAGNOSIS — I2510 Atherosclerotic heart disease of native coronary artery without angina pectoris: Secondary | ICD-10-CM

## 2024-11-23 DIAGNOSIS — I495 Sick sinus syndrome: Secondary | ICD-10-CM

## 2024-11-23 NOTE — Patient Instructions (Signed)
 Medication Instructions:  Your physician recommends that you continue on your current medications as directed. Please refer to the Current Medication list given to you today.  *If you need a refill on your cardiac medications before your next appointment, please call your pharmacy*  Lab Work: None ordered.  If you have labs (blood work) drawn today and your tests are completely normal, you will receive your results only by: MyChart Message (if you have MyChart) OR A paper copy in the mail If you have any lab test that is abnormal or we need to change your treatment, we will call you to review the results.  Testing/Procedures: None ordered.   Follow-Up: At [facility], you and your health needs are our priority.  As part of our continuing mission to provide you with exceptional heart care, our providers are all part of one team.  This team includes your primary Cardiologist (physician) and Advanced Practice Providers or APPs (Physician Assistants and Nurse Practitioners) who all work together to provide you with the care you need, when you need it.  Your next appointment:   2 years with Dr Almetta

## 2024-11-23 NOTE — Progress Notes (Signed)
 CASHIUS GRANDSTAFF                                          MRN: [id number]   11/23/2024   The [facility] Quality Team Specialist reviewed this patient medical record for the purposes of chart review for care gap closure. The following were reviewed: chart review for care gap closure-colorectal cancer screening and controlling blood pressure.    [facility] Quality Team

## 2024-12-10 ENCOUNTER — Other Ambulatory Visit: Payer: Self-pay | Admitting: Internal Medicine

## 2024-12-10 DIAGNOSIS — I119 Hypertensive heart disease without heart failure: Secondary | ICD-10-CM
